# Patient Record
Sex: Female | Born: 1955 | Race: White | Hispanic: No | Marital: Married | Smoking: Former smoker
Health system: Southern US, Community
[De-identification: ages and names within clinical notes are randomized; demographics above are authoritative.]

## PROBLEM LIST (undated history)

## (undated) DIAGNOSIS — F32A Depression, unspecified: Secondary | ICD-10-CM

## (undated) DIAGNOSIS — E119 Type 2 diabetes mellitus without complications: Secondary | ICD-10-CM

## (undated) DIAGNOSIS — I2510 Atherosclerotic heart disease of native coronary artery without angina pectoris: Secondary | ICD-10-CM

## (undated) DIAGNOSIS — I10 Essential (primary) hypertension: Secondary | ICD-10-CM

## (undated) DIAGNOSIS — I219 Acute myocardial infarction, unspecified: Secondary | ICD-10-CM

## (undated) DIAGNOSIS — E7800 Pure hypercholesterolemia, unspecified: Secondary | ICD-10-CM

## (undated) DIAGNOSIS — D649 Anemia, unspecified: Secondary | ICD-10-CM

## (undated) DIAGNOSIS — E559 Vitamin D deficiency, unspecified: Secondary | ICD-10-CM

## (undated) DIAGNOSIS — F329 Major depressive disorder, single episode, unspecified: Secondary | ICD-10-CM

## (undated) HISTORY — PX: BACK SURGERY: SHX140

## (undated) HISTORY — PX: OTHER SURGICAL HISTORY: SHX169

---

## 2010-12-07 ENCOUNTER — Ambulatory Visit: Payer: Self-pay

## 2011-09-19 ENCOUNTER — Ambulatory Visit: Payer: Self-pay

## 2011-10-03 HISTORY — PX: CORONARY ANGIOPLASTY WITH STENT PLACEMENT: SHX49

## 2012-08-01 LAB — BASIC METABOLIC PANEL
BUN: 10 mg/dL (ref 7–18)
Co2: 27 mmol/L (ref 21–32)
Creatinine: 0.75 mg/dL (ref 0.60–1.30)
EGFR (Non-African Amer.): >60
Glucose: 369 mg/dL — ABNORMAL HIGH (ref 65–99)
Osmolality: 288 (ref 275–301)

## 2012-08-01 LAB — CBC
HGB: 14.7 g/dL (ref 12.0–16.0)
MCH: 34.1 pg — ABNORMAL HIGH (ref 26.0–34.0)
MCV: 98 fL (ref 80–100)
Platelet: 338 10*3/uL (ref 150–440)
RBC: 4.3 10*6/uL (ref 3.80–5.20)
WBC: 11.1 10*3/uL — ABNORMAL HIGH (ref 3.6–11.0)

## 2012-08-01 LAB — CK TOTAL AND CKMB (NOT AT ARMC)
CK, Total: 88 U/L (ref 21–215)
CK-MB: 1.3 ng/mL (ref 0.5–3.6)

## 2012-08-02 ENCOUNTER — Inpatient Hospital Stay: Payer: Self-pay | Admitting: Internal Medicine

## 2012-08-02 LAB — TROPONIN I
Troponin-I: 4.61 ng/mL — ABNORMAL HIGH
Troponin-I: 4.95 ng/mL — ABNORMAL HIGH

## 2012-08-02 LAB — HEPATIC FUNCTION PANEL A (ARMC)
Alkaline Phosphatase: 170 U/L — ABNORMAL HIGH (ref 50–136)
Bilirubin, Direct: <0.05 mg/dL (ref 0.00–0.20)
Bilirubin,Total: 0.4 mg/dL (ref 0.2–1.0)
Total Protein: 8.1 g/dL (ref 6.4–8.2)

## 2012-08-02 LAB — APTT
Activated PTT: 52 secs — ABNORMAL HIGH (ref 23.6–35.9)
Activated PTT: 66.6 secs — ABNORMAL HIGH (ref 23.6–35.9)
Activated PTT: 92 secs — ABNORMAL HIGH (ref 23.6–35.9)

## 2012-08-02 LAB — CK TOTAL AND CKMB (NOT AT ARMC)
CK-MB: 17.1 ng/mL — ABNORMAL HIGH (ref 0.5–3.6)
CK-MB: 12.6 ng/mL — ABNORMAL HIGH (ref 0.5–3.6)

## 2012-08-03 DIAGNOSIS — R079 Chest pain, unspecified: Secondary | ICD-10-CM

## 2012-08-03 LAB — LIPID PANEL
Cholesterol: 182 mg/dL (ref 0–200)
HDL Cholesterol: 27 mg/dL — ABNORMAL LOW (ref 40–60)
Ldl Cholesterol, Calc: 122 mg/dL — ABNORMAL HIGH (ref 0–100)
VLDL Cholesterol, Calc: 33 mg/dL (ref 5–40)

## 2012-08-03 LAB — BASIC METABOLIC PANEL
BUN: 11 mg/dL (ref 7–18)
Calcium, Total: 8.3 mg/dL — ABNORMAL LOW (ref 8.5–10.1)
EGFR (Non-African Amer.): >60
Glucose: 177 mg/dL — ABNORMAL HIGH (ref 65–99)
Potassium: 3.6 mmol/L (ref 3.5–5.1)
Sodium: 139 mmol/L (ref 136–145)

## 2012-08-03 LAB — CK TOTAL AND CKMB (NOT AT ARMC): CK-MB: 8.8 ng/mL — ABNORMAL HIGH (ref 0.5–3.6)

## 2012-08-03 LAB — PLATELET COUNT: Platelet: 284 10*3/uL (ref 150–440)

## 2012-08-21 ENCOUNTER — Encounter: Payer: Self-pay | Admitting: Cardiology

## 2012-09-01 ENCOUNTER — Encounter: Payer: Self-pay | Admitting: Cardiology

## 2012-10-02 ENCOUNTER — Encounter: Payer: Self-pay | Admitting: Cardiology

## 2012-10-15 ENCOUNTER — Ambulatory Visit: Payer: Self-pay | Admitting: Internal Medicine

## 2012-11-02 ENCOUNTER — Encounter: Payer: Self-pay | Admitting: Cardiology

## 2013-02-12 ENCOUNTER — Ambulatory Visit: Payer: Self-pay | Admitting: Internal Medicine

## 2013-02-12 LAB — CBC
MCH: 33.5 pg (ref 26.0–34.0)
MCHC: 35 g/dL (ref 32.0–36.0)
MCV: 96 fL (ref 80–100)
Platelet: 188 10*3/uL (ref 150–440)
RBC: 4.3 10*6/uL (ref 3.80–5.20)

## 2013-02-12 LAB — BASIC METABOLIC PANEL
Anion Gap: 8 (ref 7–16)
BUN: 12 mg/dL (ref 7–18)
Calcium, Total: 8.9 mg/dL (ref 8.5–10.1)
Chloride: 110 mmol/L — ABNORMAL HIGH (ref 98–107)
Glucose: 257 mg/dL — ABNORMAL HIGH (ref 65–99)
Osmolality: 284 (ref 275–301)

## 2013-02-12 LAB — CK TOTAL AND CKMB (NOT AT ARMC)
CK, Total: 67 U/L (ref 21–215)
CK-MB: <0.5 ng/mL — ABNORMAL LOW (ref 0.5–3.6)

## 2013-02-13 LAB — BASIC METABOLIC PANEL
Calcium, Total: 9.1 mg/dL (ref 8.5–10.1)
Chloride: 107 mmol/L (ref 98–107)
Creatinine: 0.6 mg/dL (ref 0.60–1.30)
EGFR (Non-African Amer.): >60
Glucose: 115 mg/dL — ABNORMAL HIGH (ref 65–99)
Osmolality: 282 (ref 275–301)
Potassium: 3.8 mmol/L (ref 3.5–5.1)
Sodium: 141 mmol/L (ref 136–145)

## 2013-02-13 LAB — LIPID PANEL
Ldl Cholesterol, Calc: 59 mg/dL (ref 0–100)
Triglycerides: 158 mg/dL (ref 0–200)
VLDL Cholesterol, Calc: 32 mg/dL (ref 5–40)

## 2013-02-13 LAB — CBC WITH DIFFERENTIAL/PLATELET
Basophil %: 0.7 %
Eosinophil %: 1.8 %
HCT: 41.7 % (ref 35.0–47.0)
HGB: 14.2 g/dL (ref 12.0–16.0)
Lymphocyte #: 1.7 10*3/uL (ref 1.0–3.6)
Lymphocyte %: 17.5 %
MCH: 32.7 pg (ref 26.0–34.0)
MCHC: 34 g/dL (ref 32.0–36.0)
Monocyte #: 0.7 x10 3/mm (ref 0.2–0.9)
Neutrophil #: 7.1 10*3/uL — ABNORMAL HIGH (ref 1.4–6.5)
Neutrophil %: 73 %
Platelet: 229 10*3/uL (ref 150–440)
WBC: 9.7 10*3/uL (ref 3.6–11.0)

## 2013-11-03 ENCOUNTER — Ambulatory Visit: Payer: Self-pay

## 2015-01-19 NOTE — Discharge Summary (Signed)
PATIENT NAME:  Taylor Norris, Taylor Norris MR#:  [id number] DATE OF BIRTH:  28-May-1956  DATE OF ADMISSION:  08/02/2012 DATE OF DISCHARGE:  08/03/2012   FINAL DIAGNOSES:  1. Non-ST elevation myocardial infarction, acute.  2. Diabetes mellitus.  3. Hyperlipidemia.  4. Hypertension.  5. Anxiety.   HISTORY AND PHYSICAL: Please see dictated admission history and physical.   SUMMARY OF HOSPITAL COURSE: The patient was admitted with chest discomfort. She was found to have ST elevation myocardial infarction. Cardiology was counseled, and she underwent cardiac catheterization, which revealed 90% stenosis of the proximal LAD with 100% mid LAD stenosis with good collateral blood supply. There was 50% mid RCA stenosis as well. She underwent PCI with drug eluding stent placement in the proximal LAD with excellent results.   She underwent echocardiogram which revealed preserved LV function. She is ambulating in the room post procedure with minimal leg discomfort, no further chest pain or shortness of breath. She was felt ready to go home, so at this point she will be discharged to home in stable condition with physical activity to be up as tolerated. Will anticipate her following up with Dr. Lady GaryFath and with her primary care physician, Dr. Yates DecampJohn Walker, within the next one week and with return to work to be determined at that time. She should follow an 1,800 calorie ADA diet, 2 grams sodium diet. She will check her sugars three times a day and record this.   DISCHARGE MEDICATIONS:  1. Novolin N 25 units subcutaneous at bedtime.  2. Novolin R 10 units subcutaneous t.i.d. after meals.  3. Metformin 500 mg p.o. b.i.d., to be started on 08/04/2012.  4. Cyclobenzaprine 5 mg p.o. t.i.d. p.r.n.  5. Venlafaxine 37.5 mg p.o. b.i.d.  6. Atorvastatin 40 mg p.o. at bedtime, dose increased.  7. Metoprolol tartrate 25 mg p.o. b.i.d.  8. Nitroglycerin 0.4 mg sublingually every five minutes x3 p.r.n. chest pain.  9. Plavix 75 mg  p.o. daily.  10. Aspirin 81 mg p.o. daily.   Note: She is given instructions to hold triamterene/HCTZ as this has been replaced. She should also avoid taking omeprazole while she is on Plavix.   ____________________________ Lynnea FerrierBert J. Klein III, MD bjk:ap D: 08/03/2012 09:02:39 ET T: 08/03/2012 14:41:14 ET JOB#: [id number]  cc: Lynnea FerrierBert J. Klein III, MD, <Dictator> Daniel NonesBERT KLEIN MD ELECTRONICALLY SIGNED 08/05/2012 13:02

## 2015-01-19 NOTE — H&P (Signed)
PATIENT NAME:  Taylor Norris, Taylor Norris MR#:  [id number] DATE OF BIRTH:  [birth year]  DATE OF ADMISSION:  08/02/2012  PRIMARY CARE PHYSICIAN: Dr. Yates Decamp   REFERRING PHYSICIAN: Dr. Si Raider    CHIEF COMPLAINT: Chest pain.   HISTORY OF PRESENT ILLNESS: Taylor Norris is a 59 year old Caucasian female with past medical history of diabetes mellitus type II on insulin. She is under tremendous stress with family issues. This culminated with the death of her mother yesterday. The patient reports that over the last six weeks she is experiencing chest pain that comes across the midchest area from the right to left but originates in the middle of her sternum. These are on and off to the extent that two weeks ago she called EMT and they did an EKG for her which was unremarkable. The patient did not come to the hospital at that time. Since that time she continued to have those episodic chest pains that last somewhere between 20 minutes up to three hours. The chest pain is worse upon lying down. She will have some relief if she sits up and holds her chest. This time around 7:30 she developed chest pain in the midsternal area. It did not stop and continued. There was associated shortness of breath, nausea, and one episode of vomiting. The pain was severe and continuous. At this per minute while examining the patient she stated that chest pain is easing off. Evaluation here revealed mildly elevated troponin. The patient is being admitted for further evaluation.   REVIEW OF SYSTEMS: CONSTITUTIONAL: Denies any fever. No chills. No fatigue. EYES: No blurring of vision. No double vision. ENT: No hearing impairment. No sore throat. No dysphagia. CARDIOVASCULAR: Reports chest pain and shortness of breath as above. No syncope. RESPIRATORY: Reports shortness of breath and chest pain. No cough. No hemoptysis. GASTROINTESTINAL: No abdominal pain but she had nausea and vomiting x1. GENITOURINARY: No dysuria. No frequency of  urination. MUSCULOSKELETAL: No joint pain or swelling other than her chronic neck pain. No muscular pain or swelling. INTEGUMENTARY: No skin rash. No ulcers. NEUROLOGY: No focal weakness. No seizure activity. No headache. PSYCHIATRY: She has a great deal of stress and anxiety. No depression. ENDOCRINE: No polyuria or polydipsia. No heat or cold intolerance.   PAST MEDICAL HISTORY: Diabetes mellitus type 2 on insulin, otherwise unremarkable history.   PAST SURGICAL HISTORY:  1. Cervical neck fusion.  2. History of Cesarean section.   SOCIAL HABITS: Chronic smoker of 12 cigarettes per day lately but average usually in the past was 3 cigarettes per day since age of 22. No history of alcohol or drug abuse.   SOCIAL HISTORY: She is married, living with her husband. 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 She quit five years ago to take care of her sick mother.   FAMILY HISTORY: Her mother just died yesterday at the age of 11 from complications of Alzheimer's dementia complicated by bedsores and failure to thrive. Her father died from complications of heart problems and coronary artery disease. She has a brother who died at age of 24 from alcoholic liver cirrhosis.   ADMISSION MEDICATIONS:  1. Venlafaxine 37.5 mg twice a day. 2. Triamterene/hydrochlorothiazide once a day. 3. Novolin R insulin 10 units 3 times a day. 4. Novolin N insulin 25 units at night once a day  5. Metformin 500 mg twice a day.  6. Cyclobenzaprine 5 mg three times a day p.r.n.  7. Atorvastatin 20 mg once a day.  ALLERGIES: Penicillin causes angioedema. Sulfa causes skin rash.   PHYSICAL EXAMINATION:   VITAL SIGNS: Blood pressure 143/77, respiratory rate 18, pulse 82, temperature 97.7, oxygen saturation 99%.   GENERAL APPEARANCE: Middle-aged female laying in bed in no acute distress.   HEAD: No pallor. No icterus. No cyanosis.   EARS, NOSE, AND THROAT: Hearing was normal. Nasal mucosa, lips,  tongue were normal.   EYES: Normal eyelids and conjunctivae. Pupils about 5 mm, equal and reactive to light.   NECK: Supple. Trachea at midline. No thyromegaly. No cervical lymphadenopathy. No masses.   HEART: Normal S1, S2. No S3 or S4. No murmur. No gallop. No carotid bruits.   RESPIRATORY: Normal breathing pattern without use of accessory muscles. No rales. No wheezing.   ABDOMEN: Soft without tenderness. No hepatosplenomegaly. No masses. No hernias.   SKIN: No ulcers. No subcutaneous nodules.   MUSCULOSKELETAL: No joint swelling. No clubbing.   NEUROLOGIC: Cranial nerves II through XII are intact. No focal motor deficit.   PSYCHIATRIC: The patient is alert and oriented x3. Mood and affect were normal.   LABORATORY, DIAGNOSTIC, AND RADIOLOGICAL DATA: EKG showed normal sinus rhythm at rate of 92 per minute. Incomplete right bundle branch block. T wave inversion in V1, V2, and aVL.   Serum glucose elevated at 369, BUN 10, creatinine 0.7, sodium 137, potassium 3.5. Liver function tests were normal except for slight elevation of alkaline phosphatase at 117. Total CK 88. CPK-MB fraction normal at 1.3. However, troponin is elevated at 0.5. CBC showed white count of 11,000, hemoglobin 14, hematocrit 42, platelet count 338.   ASSESSMENT:  1. Chest pain x6 weeks exacerbated in the last 24 hours, suspicious for unstable angina and possibly non-ST elevation myocardial infarction. There is associated elevated troponin.  2. Diabetes mellitus type II, on insulin.  3. Tobacco abuse.  4. Anxiety and family stress.  5. Chronic neck pain and history of cervical neck fusion.   PLAN:  1. Will admit the patient to the Intensive Care Unit.  2. Aspirin will be initiated.  3. I will start the patient on IV heparin protocol.  4. Sublingual nitroglycerin p.r.n.  5. Beta-blocker using metoprolol.  6. Follow-up on troponin.  7. Cardiology consultation.  8. Echocardiogram.  9. Add Plavix as well.   10. Insulin sliding scale and follow-up on blood sugar.  11. I will hold the diuretic to allow room for the beta-blocker.  12. I will also hold the metformin temporarily until the cardiologist decides whether he wants to pursue cardiac cath or not.   TIME SPENT EVALUATING THIS PATIENT: More than one hour.   ____________________________ Carney CornersAmir M. Rudene Rearwish, MD amd:drc D: 08/02/2012 01:49:14 ET T: 08/02/2012 06:03:19 ET JOB#: [id number]  cc: Carney CornersAmir M. Rudene Rearwish, MD, <Dictator> John B. Danne HarborWalker III, MD Karolee OhsAMIR Dala DockM Cason Dabney MD ELECTRONICALLY SIGNED 08/03/2012 22:33

## 2015-01-19 NOTE — Consult Note (Signed)
General Aspect patient is a 59 year old female with history of hypertension, diabetes mellitus, tobacco abuse, family history of heart disease who is admitted with progressive persistent chest pain. She has ruled in for a non-ST elevation myocardial infarction. Patient states she has had midsternal chest pain for several months. The episode prompting her admission has been present for 10-14 hours. Initial EKG was unremarkable with nonspecific ST T wave changes with sinus rhythm. Her initial serum troponin was 0.5. Subsequent serum troponin was 4.5. She still has mild chest tightness. Risk factors include family history, hypertension, hyperlipidemia, diabetes, she also is a tobacco smoker. She is currently hemodynamically stable.  she has been under a lot of stress recently due to  the illness and death of her mother  which occurred yesterday.   Physical Exam:   GEN well developed, no acute distress, obese    HEENT PERRL, hearing intact to voice    NECK supple    RESP normal resp effort  clear BS    CARD Regular rate and rhythm  Normal, S1, S2  No murmur    ABD denies tenderness  normal BS  no Adominal Mass    LYMPH negative neck, negative axillae    EXTR negative cyanosis/clubbing, negative edema    SKIN normal to palpation    NEURO cranial nerves intact, motor/sensory function intact    PSYCH A+O to time, place, person   Review of Systems:   Subjective/Chief Complaint chest pain    General: No Complaints    Skin: No Complaints    ENT: No Complaints    Eyes: No Complaints    Neck: No Complaints    Respiratory: No Complaints    Cardiovascular: Chest pain or discomfort    Gastrointestinal: No Complaints    Genitourinary: No Complaints    Vascular: No Complaints    Musculoskeletal: No Complaints    Neurologic: No Complaints    Hematologic: No Complaints    Endocrine: No Complaints    Psychiatric: No Complaints    Review of Systems: All other systems were  reviewed and found to be negative    Medications/Allergies Reviewed Medications/Allergies reviewed     Diabetes:    C-Section:    Cervical Fusion:   Home Medications: Medication Instructions Status  Novolin N 100 units/mL subcutaneous suspension 25 unit(s) subcutaneous once a day (at bedtime) Active  Novolin R 10 unit(s) subcutaneous 3 times a day (after meals) Active  metformin 500 mg oral tablet, extended release 1 tab(s) orally 2 times a day Active  cyclobenzaprine 5 mg oral tablet 1 tab(s) orally 3 times a day, As Needed Active  triam/hctz milligram(s) orally once a day Active  venlafaxine 37.5 mg oral tablet 1 tab(s) orally 2 times a day Active  atorvastatin 20 mg oral tablet 1 tab(s) orally once a day (at bedtime) Active   EKG:   EKG NSR    Abnormal NSSTTW changes    Penicillin: Angioedema  Sulfa drugs: Hives    Impression 59 year old female with no prior cardiac history with history of diabetes, hypertension, hyperlipidemia and tobacco abuse who was admitted with progressive chest pain. She has ruled in for a non-ST elevation myocardial infarction with a serum troponin 4.5. EKG shows nonspecific ST-T wave changes. She is currently hemodynamically stable but continues to have mild chest discomfort. She has been under a lot of stress due to  the illness and recent death of her mother. given her risk factors, elevated troponin and symptoms, will need to  evaluate her coronary anatomy. risk and benefits of left cardiac catheterization were explained to the patient and she agrees to proceed    Plan 2. Continue current medications however will discontinue Plavix for now until anatomy is elucidated 2. Low-fat, low-cholesterol, low-sodium, ADA diet 3. Weight loss 4. Discontinued tobacco 5. Further recommendations after cardiac catheterization is complete   Electronic Signatures: Dalia HeadingFath, Armstrong Creasy A (MD)  (Signed 01-Nov-13 13:23)  Authored: General Aspect/Present Illness, History  and Physical Exam, Review of System, Past Medical History, Home Medications, EKG , Allergies, Impression/Plan   Last Updated: 01-Nov-13 13:23 by Dalia HeadingFath, Jovoni Borkenhagen A (MD)

## 2015-01-22 NOTE — H&P (Signed)
PATIENT NAME:  Taylor Norris, Taylor Norris MR#:  [id number] DATE OF BIRTH:  11/16/1955  DATE OF ADMISSION:  02/12/2013  PRIMARY CARE PHYSICIAN: Dr. Yates DecampJohn Walker.  CARDIOLOGIST:  Dr. Lady GaryFath.  CHIEF COMPLAINT: Chest pain.   HISTORY OF PRESENT ILLNESS: This is a 59 year old female who had a stent in last November, and had a heart attack at that time. She presents back to the ER today. She let her dogs out this morning at 5:15 a.m., went back to sleep until 10:00 a.m. Did not feel good. She  ignored the chest pain that she had for awhile. It was a stabbing, then an ice-cold feeling in the chest, then a tightness, 8/10 in intensity. She has had left shoulder pain for about 3 days, pain across her shoulders and in her shoulder blades, associated with shortness of breath, nausea, and diaphoresis this morning, currently still having 5/10 chest pain. In the ER, EKG showed a sinus bradycardia, low voltage, septal infarct. Troponin was negative. Hospitalist services were contacted for further evaluation.   PAST MEDICAL HISTORY: Coronary artery disease with a stent in the LAD, diabetes, hypertension, hyperlipidemia, obesity and depression.   PAST SURGICAL HISTORY: Two C-sections, and a neck surgery.   ALLERGIES: PENICILLIN, SULFA, AND MORPHINE.   MEDICATIONS: As per Prescription Writer, include aspirin 81 mg daily, atorvastatin 20 mg at bedtime, cyclobenzaprine 5 mg 3 times a day as needed for muscle spasms, Dyazide 25/37.5,  1 tablet daily, nitroglycerin 0.4 mg sublingually every 5 minutes as needed for chest pain, Novolin N 25 units subcutaneous injection at nighttime, Novolin R 10 units 3 times a day with meals, venlafaxine 75 mg at bedtime.   SOCIAL HISTORY: No smoking. Rare alcohol. No drug use. Not working currently. Used to be an Lobbyistactivities director, and she is also a Research scientist (physical sciences)certified chef and was taking care of her mother for awhile until she passed away.    FAMILY HISTORY: Grandfather died at 6942 of an MI, also  drank alcohol. Father died at 4958 of heart failure. He also had a CABG, carotid endarterectomy, CVA. Brother died at 6859 of esophageal varices, also had a CABG, and mother died of Alzheimer's. A sister was murdered at age 58.   REVIEW OF SYSTEMS:  CONSTITUTIONAL: No fever, no chills. Positive for sweats. Positive 5-pound weight gain in  2 weeks.  EYES: She does wear glasses.  EARS, NOSE, MOUTH, AND THROAT: No hearing loss. No sore throat.  CARDIOVASCULAR: Positive for chest pain.  RESPIRATORY: Positive for shortness of breath.  GASTROINTESTINAL: Positive for nausea. No vomiting. No abdominal pain. No diarrhea. No constipation.  GENITOURINARY: No burning on urination. No hematuria.  MUSCULOSKELETAL: Occasional joint pain.  INTEGUMENT: No rashes.  NEUROLOGICAL: No fainting.  PSYCHIATRIC: Positive for depression. No suicidal ideation. No homicidal ideation.  ENDOCRINE: No thyroid problems.  HEMATOLOGIC/LYMPHATIC: No anemia.   PHYSICAL EXAMINATION: VITAL SIGNS: Temperature 97.7, pulse 54, respirations 18, blood pressure 102/58, pulse ox 97% on room air.  GENERAL: No respiratory distress.  EYES: Conjunctivae and lids normal. Pupils equal, round, and reactive to light. Extraocular muscles intact. No nystagmus.  EARS, NOSE, MOUTH, AND THROAT: Tympanic membranes: No erythema. Nasal mucosa: No erythema.  THROAT: No erythema. No exudate seen.  TEETH, LIPS AND GUMS: No lesions.  NECK: No JVD. No bruits. No lymphadenopathy. No thyromegaly. No thyroid nodules palpated.  LUNGS: Clear to auscultation. No use of accessory muscles to breathe. No rhonchi, rales, or wheeze heard.  CARDIOVASCULAR: S1, S2 normal. No gallops, rubs or murmurs  heard. Carotid upstroke  2+ bilaterally. No bruits.  EXTREMITIES: Dorsalis pedis pulses 2+ bilaterally. No edema of the lower extremities.  ABDOMEN: Soft, nontender. No organosplenomegaly. Normoactive bowel sounds. No masses felt.  LYMPHATIC: No lymph nodes in the neck.   MUSCULOSKELETAL: No clubbing, edema or cyanosis.  SKIN: No rashes or ulcers seen.  NEUROLOGIC: Cranial nerves II-XII grossly intact. Deep tendon reflexes 2+, bilateral lower extremities.  PSYCHIATRIC: The patient is oriented to person, place and time.   LABORATORY AND RADIOLOGICAL DATA: White blood cell count 9.0, H and H   14.4 and 41.1, platelet count of 188, glucose 257, BUN 12, creatinine 0.70, sodium  138, potassium 4.2, chloride 110, CO2 of 20, calcium 8.9. Troponin negative.   EKG: Sinus bradycardia at 59 beats per minute, low-voltage septal infarct.    ASSESSMENT AND PLAN: 1.  Chest pain, likely unstable angina, with history of coronary artery disease: Case discussed with Dr. Gwen Pounds covering for Dr. Lady Gary. To consider cardiac catheterization. The patient already took aspirin prior to coming into the hospital. Will continue that on a daily basis. Pharmacy tech tried to verify whether she is taking Plavix or Effient. I do not think she is. May need after cardiac cath. We will add metoprolol 25 mg b.i.d., and nitro patch.  2.  Hypertension: Blood pressure currently stable.  3.  Diabetes: Will check a hemoglobin A1c, put on sliding-scale, and will give half a dose of insulin tonight.  4.  Hyperlipidemia: Check a lipid profile in the a.m.; continue atorvastatin.  5.  Obesity. Weight loss definitely needed.  6.  Depression: The patient is having a hard time with this. Would increase venlafaxine to 150 mg at bedtime.   Time spent on admission: Fifty-five minutes.    ____________________________ Herschell Dimes. Renae Gloss, MD rjw:dm D: 02/12/2013 14:06:22 ET T: 02/12/2013 14:52:36 ET JOB#: [id number]  cc: Herschell Dimes. Renae Gloss, MD, <Dictator> John B. Danne Harbor, MD Darlin Priestly Lady Gary, MD Salley Scarlet MD ELECTRONICALLY SIGNED 02/20/2013 15:50

## 2015-01-22 NOTE — Consult Note (Signed)
PATIENT NAME:  Taylor Norris, Taylor Norris MR#:  [id number] DATE OF BIRTH:  Jun 11, 1956  DATE OF CONSULTATION:  02/12/2013  REFERRING PHYSICIAN:   Dr. Fonnie BirkenheadWhiting CONSULTING PHYSICIAN:  Lamar BlinksBruce J. Lilyauna Miedema, MD  REASON FOR CONSULTATION: Unstable angina with coronary artery disease, hypertension, hyperlipidemia, diabetes.   CHIEF COMPLAINT: "I have chest pain."  HISTORY OF PRESENT ILLNESS:  This is a 59 year old female with known coronary artery disease status post previous stent placement in left anterior descending artery and atretic distal left anterior descending artery with significant stenosis of other arteries. She has had appropriate medication management recently and has had a new onset of substernal chest discomfort radiating in her back causing tingling and shortness of breath when she awakened this morning. The patient has had this chest pain throughout the morning and was seen in the Emergency Room with some improvements with symptoms by the nitroglycerin. The patient now feels much better, although still has some residual chest discomfort. EKG shows normal sinus rhythm with no evidence of acute myocardial infarction. Troponin and CK-MB are within normal limits.   The remainder of review of systems negative for vision change, ringing in the ears, hearing loss, cough, congestion, heartburn, nausea, vomiting, diarrhea, bloody stools, stomach pain, extremity pain, leg weakness, cramping of the buttocks, known blood clots, headaches, blackouts, dizzy spells, nosebleeds, congestion, trouble swallowing, frequent urination, urination at night, muscle weakness, numbness, anxiety, depression, skin lesions or skin rashes.   PAST MEDICAL HISTORY: 1.  Coronary artery disease.  2.  Hypertension.  3.  Hyperlipidemia.  4.  Diabetes mellitus.  5.  Previous stenting.   FAMILY HISTORY: Positive for father having a myocardial infarction and death.   SOCIAL HISTORY:  Smokes 1 pack per day, denies alcohol use.    ALLERGIES:  As listed.   MEDICATIONS:  As listed.   PHYSICAL EXAMINATION: VITAL SIGNS: Blood pressure is 122/68 bilaterally, heart rate 72 upright, reclining and regular.  GENERAL:  She is a well-appearing female in no acute distress.  HEAD, EYES, EARS, NOSE AND THROAT:  No icterus, thyromegaly, ulcers, hemorrhage or xanthelasma.  CARDIOVASCULAR:  Regular rate and rhythm with normal S1 and S2 without murmur, gallop or rub. PMI is normal size and placement. Carotid upstroke normal without bruit. Jugular venous pressure normal.  LUNGS:  Have few basilar crackles with normal respirations.  ABDOMEN: Soft, nontender, without hepatosplenomegaly or masses. Abdominal aorta is normal size without bruit.  EXTREMITIES: Shows 2+ bilateral pulses in dorsal, pedal, radial and femoral arteries without lower extremity edema, cyanosis, clubbing or ulcers.  NEUROLOGIC:  She is oriented to time, place and person with normal mood and affect.   ASSESSMENT: A 59 year old female with coronary artery disease, hypertension, hyperlipidemia, diabetes, tobacco abuse with acute onset of substernal chest discomfort without evidence of current myocardial infarction with [place] class IV anginal equivalent.   RECOMMENDATIONS:  Proceed to cardiac catheterization to assess coronary anatomy and further treatment thereof as necessary. The patient understands the risks and benefits of cardiac catheterization. This includes the possibility of death, stroke, heart attack, infection, bleeding or blood clot. She is at low risk for conscious sedation.   ____________________________ Lamar BlinksBruce J. Dejohn Ibarra, MD bjk:ce D: 02/12/2013 17:34:26 ET T: 02/12/2013 17:53:24 ET JOB#: [id number]  cc: Lamar BlinksBruce J. Zamyia Gowell, MD, <Dictator> Lamar BlinksBRUCE J Yaffa Seckman MD ELECTRONICALLY SIGNED 02/13/2013 7:57

## 2015-12-15 ENCOUNTER — Other Ambulatory Visit: Payer: Self-pay | Admitting: Obstetrics and Gynecology

## 2015-12-15 ENCOUNTER — Ambulatory Visit
Admission: RE | Admit: 2015-12-15 | Discharge: 2015-12-15 | Disposition: A | Discharge status: Home / Self Care | Payer: Medicare HMO | Source: Ambulatory Visit | Attending: Obstetrics and Gynecology | Admitting: Obstetrics and Gynecology

## 2015-12-15 DIAGNOSIS — Z1231 Encounter for screening mammogram for malignant neoplasm of breast: Secondary | ICD-10-CM

## 2016-02-04 ENCOUNTER — Encounter: Payer: Self-pay | Admitting: *Deleted

## 2016-02-07 ENCOUNTER — Ambulatory Visit
Admission: RE | Admit: 2016-02-07 | Discharge: 2016-02-07 | Disposition: A | Discharge status: Home / Self Care | Payer: Medicare HMO | Source: Ambulatory Visit | Attending: Gastroenterology | Admitting: Gastroenterology

## 2016-02-07 ENCOUNTER — Ambulatory Visit: Payer: Medicare HMO | Admitting: Anesthesiology

## 2016-02-07 ENCOUNTER — Encounter
Admission: RE | Disposition: A | Discharge status: Home / Self Care | Payer: Self-pay | Source: Ambulatory Visit | Attending: Gastroenterology

## 2016-02-07 DIAGNOSIS — F329 Major depressive disorder, single episode, unspecified: Secondary | ICD-10-CM | POA: Insufficient documentation

## 2016-02-07 DIAGNOSIS — Z87891 Personal history of nicotine dependence: Secondary | ICD-10-CM | POA: Insufficient documentation

## 2016-02-07 DIAGNOSIS — Z7902 Long term (current) use of antithrombotics/antiplatelets: Secondary | ICD-10-CM | POA: Diagnosis not present

## 2016-02-07 DIAGNOSIS — K635 Polyp of colon: Secondary | ICD-10-CM | POA: Diagnosis not present

## 2016-02-07 DIAGNOSIS — I252 Old myocardial infarction: Secondary | ICD-10-CM | POA: Insufficient documentation

## 2016-02-07 DIAGNOSIS — Z7982 Long term (current) use of aspirin: Secondary | ICD-10-CM | POA: Diagnosis not present

## 2016-02-07 DIAGNOSIS — Z79899 Other long term (current) drug therapy: Secondary | ICD-10-CM | POA: Diagnosis not present

## 2016-02-07 DIAGNOSIS — E7800 Pure hypercholesterolemia, unspecified: Secondary | ICD-10-CM | POA: Diagnosis not present

## 2016-02-07 DIAGNOSIS — E559 Vitamin D deficiency, unspecified: Secondary | ICD-10-CM | POA: Diagnosis not present

## 2016-02-07 DIAGNOSIS — I10 Essential (primary) hypertension: Secondary | ICD-10-CM | POA: Insufficient documentation

## 2016-02-07 DIAGNOSIS — E119 Type 2 diabetes mellitus without complications: Secondary | ICD-10-CM | POA: Insufficient documentation

## 2016-02-07 DIAGNOSIS — Z794 Long term (current) use of insulin: Secondary | ICD-10-CM | POA: Insufficient documentation

## 2016-02-07 DIAGNOSIS — Z1211 Encounter for screening for malignant neoplasm of colon: Secondary | ICD-10-CM | POA: Insufficient documentation

## 2016-02-07 DIAGNOSIS — Z9889 Other specified postprocedural states: Secondary | ICD-10-CM | POA: Diagnosis not present

## 2016-02-07 DIAGNOSIS — I2510 Atherosclerotic heart disease of native coronary artery without angina pectoris: Secondary | ICD-10-CM | POA: Insufficient documentation

## 2016-02-07 HISTORY — DX: Atherosclerotic heart disease of native coronary artery without angina pectoris: I25.10

## 2016-02-07 HISTORY — DX: Type 2 diabetes mellitus without complications: E11.9

## 2016-02-07 HISTORY — DX: Vitamin D deficiency, unspecified: E55.9

## 2016-02-07 HISTORY — DX: Anemia, unspecified: D64.9

## 2016-02-07 HISTORY — DX: Acute myocardial infarction, unspecified: I21.9

## 2016-02-07 HISTORY — DX: Depression, unspecified: F32.A

## 2016-02-07 HISTORY — PX: COLONOSCOPY WITH PROPOFOL: SHX5780

## 2016-02-07 HISTORY — DX: Major depressive disorder, single episode, unspecified: F32.9

## 2016-02-07 HISTORY — DX: Essential (primary) hypertension: I10

## 2016-02-07 HISTORY — DX: Pure hypercholesterolemia, unspecified: E78.00

## 2016-02-07 LAB — GLUCOSE, CAPILLARY: GLUCOSE-CAPILLARY: 174 mg/dL — AB (ref 65–99)

## 2016-02-07 SURGERY — COLONOSCOPY WITH PROPOFOL
Anesthesia: General

## 2016-02-07 MED ORDER — PROPOFOL 10 MG/ML IV BOLUS
INTRAVENOUS | Status: DC | PRN
  Administered 2016-02-07: 50 mg via INTRAVENOUS
  Administered 2016-02-07: 40 mg via INTRAVENOUS
  Administered 2016-02-07: 50 mg via INTRAVENOUS

## 2016-02-07 MED ORDER — PROPOFOL 500 MG/50ML IV EMUL
INTRAVENOUS | Status: DC | PRN
  Administered 2016-02-07: 250 ug/kg/min via INTRAVENOUS

## 2016-02-07 MED ORDER — LIDOCAINE HCL (CARDIAC) 20 MG/ML IV SOLN
INTRAVENOUS | Status: DC | PRN
  Administered 2016-02-07: 60 mg via INTRAVENOUS

## 2016-02-07 MED ORDER — SODIUM CHLORIDE 0.9 % IV SOLN: INTRAVENOUS | Status: DC

## 2016-02-07 MED ORDER — SODIUM CHLORIDE 0.9 % IV SOLN
INTRAVENOUS | Status: DC
  Administered 2016-02-07: 1000 mL via INTRAVENOUS

## 2016-02-07 NOTE — Transfer of Care (Signed)
Immediate Anesthesia Transfer of Care Note  Patient: Taylor Norris  Procedure(s) Performed: Procedure(s): COLONOSCOPY WITH PROPOFOL (N/A)  Patient Location: Endoscopy Unit  Anesthesia Type:General  Level of Consciousness: awake, alert  and oriented  Airway & Oxygen Therapy: Patient Spontanous Breathing  Post-op Assessment: Post -op Vital signs reviewed and stable  Post vital signs: stable  Last Vitals:  Filed Vitals:   02/07/16 0900 02/07/16 0901  BP: 97/70 97/70  Pulse: 84 81  Temp: 36 C 36 C  Resp: 16 18    Last Pain: There were no vitals filed for this visit.       Complications: No apparent anesthesia complications

## 2016-02-07 NOTE — H&P (Signed)
Primary Care Physician:  Rafael BihariWALKER III, JOHN B, MD Primary Gastroenterologist:  Dr. Bluford Kaufmannh  Pre-Procedure History & Physical: HPI:  Taylor MoutonSherrilynn Kopecky is a 60 y.o. female is here for an colonoscopy.   Past Medical History  Diagnosis Date  . Anemia   . Coronary artery disease   . Depression   . Hypertension   . Myocardial infarction (HCC)   . Hypercholesteremia   . Diabetes mellitus without complication (HCC)   . Vitamin D deficiency     Past Surgical History  Procedure Laterality Date  . Back surgery      c3 disc repair  . C section x2      Prior to Admission medications   Medication Sig Start Date End Date Taking? Authorizing Provider  aspirin 81 MG tablet Take 81 mg by mouth daily.   Yes Historical Provider, MD  Cholecalciferol 2000 units CAPS Take by mouth.   Yes Historical Provider, MD  clopidogrel (PLAVIX) 75 MG tablet Take 75 mg by mouth daily.   Yes Historical Provider, MD  cyclobenzaprine (FLEXERIL) 5 MG tablet Take 5 mg by mouth 3 (three) times daily as needed for muscle spasms.   Yes Historical Provider, MD  DULoxetine (CYMBALTA) 60 MG capsule Take 60 mg by mouth daily.   Yes Historical Provider, MD  estradiol (ESTRACE) 0.1 MG/GM vaginal cream Place 1 Applicatorful vaginally at bedtime.   Yes Historical Provider, MD  gabapentin (NEURONTIN) 300 MG capsule Take 300 mg by mouth 3 (three) times daily.   Yes Historical Provider, MD  insulin NPH Human (HUMULIN N,NOVOLIN N) 100 UNIT/ML injection Inject into the skin.   Yes Historical Provider, MD  metFORMIN (GLUCOPHAGE-XR) 500 MG 24 hr tablet Take 500 mg by mouth daily with breakfast.   Yes Historical Provider, MD  metoprolol succinate (TOPROL-XL) 25 MG 24 hr tablet Take 25 mg by mouth daily.   Yes Historical Provider, MD    Allergies as of 01/26/2016  . (Not on File)    History reviewed. No pertinent family history.  Social History   Social History  . Marital Status: Unknown    Spouse Name: N/A  . Number of  Children: N/A  . Years of Education: N/A   Occupational History  . Not on file.   Social History Main Topics  . Smoking status: Former Games developermoker  . Smokeless tobacco: Not on file  . Alcohol Use: No  . Drug Use: No  . Sexual Activity: Not on file   Other Topics Concern  . Not on file   Social History Narrative    Review of Systems: See HPI, otherwise negative ROS  Physical Exam: BP 151/81 mmHg  Pulse 75  Temp(Src) 96.5 F (35.8 C) (Tympanic)  Resp 18  Ht 5\' 4"  (1.626 m)  Wt 220 lb (99.791 kg)  BMI 37.74 kg/m2  SpO2 99% General:   Alert,  pleasant and cooperative in NAD Head:  Normocephalic and atraumatic. Neck:  Supple; no masses or thyromegaly. Lungs:  Clear throughout to auscultation.    Heart:  Regular rate and rhythm. Abdomen:  Soft, nontender and nondistended. Normal bowel sounds, without guarding, and without rebound.   Neurologic:  Alert and  oriented x4;  grossly normal neurologically.  Impression/Plan: Taylor Norris is here for an colonoscopy} to be performed for screening  Risks, benefits, limitations, and alternatives regarding  colonocopy have been reviewed with the patient.  Questions have been answered.  All parties agreeable.   Sofia Vanmeter, Ezzard StandingPAUL Y, MD  02/07/2016, 7:59  AM

## 2016-02-07 NOTE — Op Note (Signed)
[facility] Gastroenterology Patient Name: Taylor Norris Procedure Date: 02/07/2016 8:30 AM MRN: [id number] Account #: [id number] Date of Birth: 1956-07-21 Admit Type: Outpatient Age: 60 Room: [facility] ENDO ROOM 4 Gender: Female Note Status: Finalized Procedure:            Colonoscopy Indications:          Screening for colorectal malignant neoplasm Providers:            Ezzard Standing. Bluford Kaufmann, MD Referring MD:         Letta Pate. Danne Harbor, MD (Referring MD) Medicines:            Monitored Anesthesia Care Complications:        No immediate complications. Procedure:            Pre-Anesthesia Assessment:                       - Prior to the procedure, a History and Physical was                        performed, and patient medications, allergies and                        sensitivities were reviewed. The patient's tolerance of                        previous anesthesia was reviewed.                       - The risks and benefits of the procedure and the                        sedation options and risks were discussed with the                        patient. All questions were answered and informed                        consent was obtained.                       - After reviewing the risks and benefits, the patient                        was deemed in satisfactory condition to undergo the                        procedure.                       After obtaining informed consent, the colonoscope was                        passed under direct vision. Throughout the procedure,                        the patient's blood pressure, pulse, and oxygen                        saturations were monitored continuously. The  Colonoscope was introduced through the anus and                        advanced to the the cecum, identified by appendiceal                        orifice and ileocecal valve. The colonoscopy was                        performed without difficulty.  The patient tolerated the                        procedure well. The quality of the bowel preparation                        was good. Findings:      A small polyp was found in the recto-sigmoid colon. The polyp was       sessile. The polyp was removed with a hot snare. Resection and retrieval       were complete.      The exam was otherwise without abnormality. Impression:           - One small polyp at the recto-sigmoid colon, removed                        with a hot snare. Resected and retrieved.                       - The examination was otherwise normal. Recommendation:       - Discharge patient to home.                       - Repeat colonoscopy in 5 years for surveillance based                        on pathology results.                       - The findings and recommendations were discussed with                        the patient.                       - Resume plavix in 2 days Procedure Code(s):    --- Professional ---                       [id number], Colonoscopy, flexible; with removal of tumor(s),                        polyp(s), or other lesion(s) by snare technique Diagnosis Code(s):    --- Professional ---                       Z12.11, Encounter for screening for malignant neoplasm                        of colon                       D12.7, Benign neoplasm of rectosigmoid junction CPT copyright 2016  American Medical Association. All rights reserved. The codes documented in this report are preliminary and upon coder review may  be revised to meet current compliance requirements. Wallace Cullens, MD 02/07/2016 9:00:25 AM This report has been signed electronically. Number of Addenda: 0 Note Initiated On: 02/07/2016 8:30 AM Scope Withdrawal Time: 0 hours 12 minutes 25 seconds  Total Procedure Duration: 0 hours 20 minutes 5 seconds       [facility]

## 2016-02-07 NOTE — Anesthesia Postprocedure Evaluation (Signed)
Anesthesia Post Note  Patient: Purcell MoutonSherrilynn Mattix  Procedure(s) Performed: Procedure(s) (LRB): COLONOSCOPY WITH PROPOFOL (N/A)  Patient location during evaluation: Endoscopy Anesthesia Type: General Level of consciousness: awake and alert Pain management: pain level controlled Vital Signs Assessment: post-procedure vital signs reviewed and stable Respiratory status: spontaneous breathing, nonlabored ventilation, respiratory function stable and patient connected to nasal cannula oxygen Cardiovascular status: blood pressure returned to baseline and stable Postop Assessment: no signs of nausea or vomiting Anesthetic complications: no    Last Vitals:  Filed Vitals:   02/07/16 0920 02/07/16 0930  BP: 125/81 120/71  Pulse: 73 65  Temp:    Resp: 15 13    Last Pain: There were no vitals filed for this visit.               Cleda MccreedyJoseph K Dilynn Munroe

## 2016-02-07 NOTE — Anesthesia Preprocedure Evaluation (Signed)
Anesthesia Evaluation  Patient identified by MRN, date of birth, ID band Patient awake    Reviewed: Allergy & Precautions, H&P , NPO status , Patient's Chart, lab work & pertinent test results  History of Anesthesia Complications Negative for: history of anesthetic complications  Airway Mallampati: III  TM Distance: >3 FB Neck ROM: limited    Dental  (+) Poor Dentition   Pulmonary neg shortness of breath, former smoker,    Pulmonary exam normal breath sounds clear to auscultation       Cardiovascular Exercise Tolerance: Good hypertension, (-) angina+ CAD, + Past MI and + Cardiac Stents  (-) DOE Normal cardiovascular exam Rhythm:regular Rate:Normal     Neuro/Psych PSYCHIATRIC DISORDERS Depression negative neurological ROS     GI/Hepatic negative GI ROS, Neg liver ROS,   Endo/Other  diabetes, Type 2, Insulin Dependent  Renal/GU negative Renal ROS  negative genitourinary   Musculoskeletal   Abdominal   Peds  Hematology negative hematology ROS (+)   Anesthesia Other Findings Past Medical History:   Anemia                                                       Coronary artery disease                                      Depression                                                   Hypertension                                                 Myocardial infarction (HCC)                                  Hypercholesteremia                                           Diabetes mellitus without complication (HCC)                 Vitamin D deficiency                                        Past Surgical History:   BACK SURGERY                                                    Comment:c3 disc repair   c section x2  BMI    Body Mass Index   37.74 kg/m 2      Reproductive/Obstetrics negative OB ROS                             Anesthesia  Physical Anesthesia Plan  ASA: III  Anesthesia Plan: General   Post-op Pain Management:    Induction:   Airway Management Planned:   Additional Equipment:   Intra-op Plan:   Post-operative Plan:   Informed Consent: I have reviewed the patients History and Physical, chart, labs and discussed the procedure including the risks, benefits and alternatives for the proposed anesthesia with the patient or authorized representative who has indicated his/her understanding and acceptance.   Dental Advisory Given  Plan Discussed with: Anesthesiologist, CRNA and Surgeon  Anesthesia Plan Comments:         Anesthesia Quick Evaluation

## 2016-02-08 ENCOUNTER — Encounter: Payer: Self-pay | Admitting: Gastroenterology

## 2016-02-08 LAB — SURGICAL PATHOLOGY

## 2018-08-13 ENCOUNTER — Ambulatory Visit: Payer: Medicare HMO | Admitting: Physical Therapy

## 2018-08-13 ENCOUNTER — Ambulatory Visit: Payer: Medicare HMO | Attending: Obstetrics and Gynecology | Admitting: Physical Therapy

## 2018-08-13 ENCOUNTER — Encounter: Payer: Self-pay | Admitting: Physical Therapy

## 2018-08-13 ENCOUNTER — Other Ambulatory Visit: Payer: Self-pay

## 2018-08-13 DIAGNOSIS — M25512 Pain in left shoulder: Secondary | ICD-10-CM | POA: Diagnosis present

## 2018-08-13 DIAGNOSIS — M545 Low back pain, unspecified: Secondary | ICD-10-CM

## 2018-08-13 DIAGNOSIS — G8929 Other chronic pain: Secondary | ICD-10-CM | POA: Diagnosis present

## 2018-08-13 DIAGNOSIS — M62830 Muscle spasm of back: Secondary | ICD-10-CM | POA: Diagnosis present

## 2018-08-13 NOTE — Patient Instructions (Addendum)
  Bladder irritants * handout  Starting point: 120 fl oz water,  60 fl oz unsweetened tea, 4 cups of coffee, and 2 diet sodas 16 floz.        This week: decrease to 2 cups of coffee   ______   Stretches:  morning and night    1) Seated:  brush palm on thigh back to groin , bringing elbows back and shoulders roll back and down, feelign the chest lift, head moves back  10x x3   2) Seated stretches with feet on ground :  Seated twist,  Inhale, lengthen spine tall,  Exhale, turn nvael to the R , then the chest, look over R shoulderL hand on R thigh, R hand by the hips on the bed.chair  Other side.   3 breaths    ____  Stretches at [employer] counter  Lengthening stretch for back at counter hands shoulder width apart , knees slightly bend, like a mini squat 5 breaths   Keep L hand on counter R hand on L thigh, gaze under armpit  5 breaths then switch    R hand on R hip and twist ribs to R and gaze right    To stand up, shift knees forward , buttocks tuck under and then chest lifts
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
## 2018-08-20 ENCOUNTER — Ambulatory Visit: Payer: Medicare HMO | Admitting: Physical Therapy

## 2018-08-20 DIAGNOSIS — G8929 Other chronic pain: Secondary | ICD-10-CM

## 2018-08-20 DIAGNOSIS — M25512 Pain in left shoulder: Secondary | ICD-10-CM | POA: Diagnosis not present

## 2018-08-20 DIAGNOSIS — M545 Low back pain: Secondary | ICD-10-CM

## 2018-08-20 DIAGNOSIS — M62830 Muscle spasm of back: Secondary | ICD-10-CM

## 2018-08-20 NOTE — Therapy (Signed)
[facility] [facility] [address] [place], [place], [address] Phone: [phone]   Fax:  [phone]  Physical Therapy Treatment  Patient Details  Name: Taylor Norris MRN: [id number] Date of Birth: 10/22/1955 Referring Provider (PT): Dalbert GarnetBeasley   Encounter Date: 08/20/2018  PT End of Session - 08/20/18 1602    Visit Number  2    Number of Visits  12    Date for PT Re-Evaluation  11/05/18    Authorization Type  Medicaid 2/10    PT Start Time  1504    PT Stop Time  1602    PT Time Calculation (min)  58 min       Past Medical History:  Diagnosis Date  . Anemia   . Coronary artery disease   . Depression   . Diabetes mellitus without complication (HCC)   . Hypercholesteremia   . Hypertension   . Myocardial infarction (HCC)   . Vitamin D deficiency     Past Surgical History:  Procedure Laterality Date  . BACK SURGERY     c3 disc repair  . c section x2    . COLONOSCOPY WITH PROPOFOL N/A 02/07/2016   Procedure: COLONOSCOPY WITH PROPOFOL;  Surgeon: Wallace CullensPaul Y Oh, MD;  [facility];  Service: Gastroenterology;  Laterality: N/A;  . CORONARY ANGIOPLASTY WITH STENT PLACEMENT  2013    There were no vitals filed for this visit.  Subjective Assessment - 08/20/18 1558    Subjective  Pt reported 30% improvement with her LBP since last session    Pertinent History  2 c-sections, neck surgery C3,  comorbidities.  Performs water aerobics 2 x week ( 2hr each), walk 1 mile without a stretching routine    Patient Stated Goals  relief of L shoulder pain, pelvic pain, and not peeing when walking in the house.          [facility] Assessment - 08/20/18 1559      Palpation   Spinal mobility  interspinal/ paraspinal, medial mm to scapular B ,                     [facility] Adult PT Treatment/Exercise - 08/20/18 1559      Neuro Re-ed    Neuro Re-ed Details   see pt instructions      Moist Heat Therapy   Number  Minutes Moist Heat  5 Minutes    Moist Heat Location  Cervical   thoracic      Manual Therapy   Manual therapy comments  MWM/ STM at interspinal/ paraspinal, medial mm to scapular B ,                   PT Long Term Goals - 08/15/18 [id number]      PT LONG TERM GOAL #1   Title  Pt will decrease ODI score from 40% to <20 % in order to perform household chores with less pain     Time  8    Period  Weeks    Status  New      PT LONG TERM GOAL #2   Title  Pt will decrease her NDI score from  24% to  < 12 % in order to rollonto her L side and sleep sidelying      Time  8    Period  Weeks    Status  New      PT LONG TERM GOAL #3   Title  Pt will decrease PFDI from 24% to <12 % in order to restore pelvic floor function    Time  12    Period  Weeks    Status  New      PT LONG TERM GOAL #4   Title  Pt will demo decrease abdominal scar restrictions and demo proper pelvic floor coordination, ROM in order to decrease urinary frequency and urgency      Time  4    Period  Weeks    Status  New      PT LONG TERM GOAL #5   Title  Pt will demo no L SIJ hpomobility, no thoracic hypomobility in order to progress to pelvic floor exercises     Time  2    Period  Weeks    Status  New      Additional Long Term Goals   Additional Long Term Goals  Yes      PT LONG TERM GOAL #6   Title  Pt will be IND with mobility HEP     Time  10    Period  Weeks    Status  New      PT LONG TERM GOAL #7   Title  Pt will report decreased urinary frequency from 2 x / 2 hour to < 1x / hr and being able to make it to the toilet without leaking in order to improve QOL     Time  6    Period  Weeks    Status  New    Target Date  09/26/18      PT LONG TERM GOAL #8   Title  Pt will report more consistent bowel movements from once to every 3 days to everyday / every other day across 2 weeks and improved Stool consistency Stool Type 5-7 across 70% of the time, Stool Type 4  30% of the time  to < 30% of the  time, Stool Type 4 70% of the time      Time  12    Period  Weeks    Status  New    Target Date  11/07/18            Plan - 08/20/18 1603    Clinical Impression Statement  Pt demod'd decreased thoracic and cervical mm tensions post Tx. Pt reported 40% improvement with upper low back pain which had improved at 30 % after first visit.  Pt reported feeling "calm, relaxed, content" after the relaxation training. Pt continues to benefit from skilled PT.     Rehab Potential  Good    PT Frequency  1x / week    PT Duration  12 weeks    PT Treatment/Interventions  Moist Heat;Functional mobility training;Stair training;Therapeutic activities;Therapeutic exercise;Neuromuscular re-education;Scar mobilization;Taping;Electrical Stimulation;Biofeedback;Aquatic Therapy    Consulted and Agree with Plan of Care  Patient       Patient will benefit from skilled therapeutic intervention in order to improve the following deficits and impairments:  Increased muscle spasms, Increased fascial restricitons, Decreased scar mobility, Hypomobility, Impaired flexibility, Improper body mechanics, Postural dysfunction, Decreased strength, Decreased mobility, Pain, Decreased coordination, Decreased endurance, Decreased activity tolerance, Decreased balance, Decreased range of motion  Visit Diagnosis: Chronic left shoulder pain  Chronic midline low back pain, unspecified whether sciatica present  Muscle spasm of back     Problem List There are no active problems to display for this patient.   Mariane Masters ,PT, DPT, E-RYT  08/20/2018, 4:06 PM  [facility] MAIN [facility] SERVICES [address] [place], [place], [address] Phone: [phone]   Fax:  [phone]  Name: Taylor Norris MRN: [id number] Date of Birth: [birth year]

## 2018-08-20 NOTE — Patient Instructions (Addendum)
Create a relaxation time in afternoon everyday ( use a calm sounding alarm on the phone for 15 min  _ use ocean sounds which you associate relaxation with  _ notice your body sinking to the bed, arms out, letting go _beginning long paced breathing which will help with relaxing your body  , cover eyes with clothes, edge of body being supported  _ allow yourself to relax onto the surface beneath you ( can propped legs up under pillows)   When the alarm go off, don't immediate get up to turn it off,  Move gradually, log roll to the side, and then sit up.  __  Open book ( handout)

## 2018-08-27 ENCOUNTER — Encounter: Payer: Self-pay | Admitting: Physical Therapy

## 2018-09-03 ENCOUNTER — Ambulatory Visit: Payer: Medicare HMO | Attending: Obstetrics and Gynecology | Admitting: Physical Therapy

## 2018-09-03 DIAGNOSIS — G8929 Other chronic pain: Secondary | ICD-10-CM | POA: Diagnosis present

## 2018-09-03 DIAGNOSIS — M25512 Pain in left shoulder: Secondary | ICD-10-CM | POA: Diagnosis not present

## 2018-09-03 DIAGNOSIS — M62838 Other muscle spasm: Secondary | ICD-10-CM | POA: Insufficient documentation

## 2018-09-03 DIAGNOSIS — M545 Low back pain: Secondary | ICD-10-CM | POA: Insufficient documentation

## 2018-09-03 DIAGNOSIS — M62830 Muscle spasm of back: Secondary | ICD-10-CM | POA: Insufficient documentation

## 2018-09-03 NOTE — Therapy (Addendum)
[place] [facility] MAIN [facility] SERVICES [address] [place], [place], [address] Phone: [phone]   Fax:  [phone]  Physical Therapy Treatment  Patient Details  Name: Taylor Norris MRN: [id number] Date of Birth: [birth year] Referring Provider (PT): Dalbert Garnet   Encounter Date: 09/03/2018  PT End of Session - 09/03/18 1545    Visit Number  3    Number of Visits  12    Date for PT Re-Evaluation  11/05/18    Authorization Type  3/10 medicare    PT Start Time  1503    PT Stop Time  1550    PT Time Calculation (min)  47 min       Past Medical History:  Diagnosis Date  . Anemia   . Coronary artery disease   . Depression   . Diabetes mellitus without complication (HCC)   . Hypercholesteremia   . Hypertension   . Myocardial infarction (HCC)   . Vitamin D deficiency     Past Surgical History:  Procedure Laterality Date  . BACK SURGERY     c3 disc repair  . c section x2    . COLONOSCOPY WITH PROPOFOL N/A 02/07/2016   Procedure: COLONOSCOPY WITH PROPOFOL;  Surgeon: Wallace Cullens, MD;  Location: [facility] ENDOSCOPY;  Service: Gastroenterology;  Laterality: N/A;  . CORONARY ANGIOPLASTY WITH STENT PLACEMENT  2013    There were no vitals filed for this visit.  Subjective Assessment - 09/03/18 1515    Subjective  Pt was able to sleep for 13 days out of 2weeks without shoulder pain. The shoulder pain is there with the cold weather. Pt cut back on her coffee.     Pertinent History  2 c-sections, neck surgery C3,  comorbidities.  Performs water aerobics 2 x week ( 2hr each), walk 1 mile without a stretching routine    Patient Stated Goals  relief of L shoulder pain, pelvic pain, and not peeing when walking in the house.          [facility] PT Assessment - 09/03/18 1541      Observation/Other Assessments   Observations  forward head at C/T j unction 130 deg        Palpation   Spinal mobility  signficant tensions along upper thoracic, levator.scalenes,  upper trap B ( decreased post Tx)                    [facility] Adult PT Treatment/Exercise - 09/03/18 1542      Neuro Re-ed    Neuro Re-ed Details   see pt instructions      Moist Heat Therapy   Number Minutes Moist Heat  5 Minutes    Moist Heat Location  Cervical      Manual Therapy   Manual therapy comments  MWM/ STM at problem areas noted                   PT Long Term Goals - 09/03/18 1551      PT LONG TERM GOAL #1   Title  Pt will decrease ODI score from 40% to <20 % in order to perform household chores with less pain     Time  8    Period  Weeks    Status  New      PT LONG TERM GOAL #2   Title  Pt will decrease her NDI score from  24% to  < 12 % in order to rollonto her L side  and sleep sidelying      Time  8    Period  Weeks    Status  New      PT LONG TERM GOAL #3   Title  Pt will decrease PFDI from 24% to <12 % in order to restore pelvic floor function    Time  12    Period  Weeks    Status  New      PT LONG TERM GOAL #4   Title  Pt will demo decrease abdominal scar restrictions and demo proper pelvic floor coordination, ROM in order to decrease urinary frequency and urgency      Time  4    Period  Weeks    Status  New      PT LONG TERM GOAL #5   Title  Pt will demo no L SIJ hpomobility, no thoracic hypomobility in order to progress to pelvic floor exercises     Time  2    Period  Weeks    Status  New      PT LONG TERM GOAL #6   Title  Pt will be IND with mobility HEP     Time  10    Period  Weeks    Status  New      PT LONG TERM GOAL #7   Title  Pt will report decreased urinary frequency from 2 x / 2 hour to < 1x / hr and being able to make it to the toilet without leaking in order to improve QOL     Time  6    Period  Weeks    Status  New      PT LONG TERM GOAL #8   Title  Pt will report more consistent bowel movements from once to every 3 days to everyday / every other day across 2 weeks and improved Stool consistency Stool  Type 5-7 across 70% of the time, Stool Type 4  30% of the time  to < 30% of the time, Stool Type 4 70% of the time      Time  12    Period  Weeks    Status  New      PT LONG TERM GOAL  #10   TITLE  Pt will demo increased angle at C/T junction from 130 deg to > 150 deg in order to demo upright posture and decreased upper neck / shoulder mm tensions for optimal diaphragmatic excursion and pelvic floor function    Time  12    Period  Weeks    Status  New            Plan - 09/03/18 1546    Clinical Impression Statement  Pt is improving with sleeping without neck pain for the past 13 days out 14 days. Addressed C/T mm tensions and restrictions along with upper trap/levator/scalenes with manual Tx which pt tolerated without increased pain. Initiated cervical/scapular retraction strengthening and scapular mobility exercises. Pt continues to benefit from skilled PT to improve posture and optimize deep core function.    Rehab Potential  Good    PT Frequency  1x / week    PT Duration  12 weeks    PT Treatment/Interventions  Moist Heat;Functional mobility training;Stair training;Therapeutic activities;Therapeutic exercise;Neuromuscular re-education;Scar mobilization;Taping;Electrical Stimulation;Biofeedback;Aquatic Therapy    Consulted and Agree with Plan of Care  Patient       Patient will benefit from skilled therapeutic intervention in order to improve the following deficits and impairments:  Increased muscle spasms, Increased fascial restricitons, Decreased scar mobility, Hypomobility, Impaired flexibility, Improper body mechanics, Postural dysfunction, Decreased strength, Decreased mobility, Pain, Decreased coordination, Decreased endurance, Decreased activity tolerance, Decreased balance, Decreased range of motion  Visit Diagnosis: Chronic left shoulder pain  Chronic midline low back pain, unspecified whether sciatica present  Muscle spasm of back     Problem List There are no  active problems to display for this patient.   Mariane MastersYeung,Shin Yiing ,PT, DPT, E-RYT  09/03/2018, 3:54 PM  [place] [facility] [address] [place], [place], [address] Phone: [phone]   Fax:  [phone]  Name: Taylor Norris MRN: [id number] Date of Birth: 04/10/1956

## 2018-09-03 NOTE — Patient Instructions (Signed)
Neck stretches at night/ morning on bed: 1) 6 directions of neck with shoulder blades down and back 5 reps each A. Releasing upper trap:  Tilt head to L shoulder, "shoot  Beams" through fingers pointing towards feet to lower R shoulder away from ears, turn head to look into L armpit    2)Angel wings up and bat wings down 10 reps  3) shoulder squeezes down and back with chin tuck  Count aloud 5 sec x 1 0 reps to retrain where the neck needs to be ( in line with sacrum)

## 2018-09-10 ENCOUNTER — Ambulatory Visit: Payer: Medicare HMO | Admitting: Physical Therapy

## 2018-09-10 DIAGNOSIS — M62830 Muscle spasm of back: Secondary | ICD-10-CM

## 2018-09-10 DIAGNOSIS — M545 Low back pain, unspecified: Secondary | ICD-10-CM

## 2018-09-10 DIAGNOSIS — G8929 Other chronic pain: Secondary | ICD-10-CM

## 2018-09-10 DIAGNOSIS — M25512 Pain in left shoulder: Principal | ICD-10-CM

## 2018-09-10 NOTE — Therapy (Signed)
[place] [facility] MAIN [facility] SERVICES [address] [place], [place], [address] Phone: [phone]   Fax:  [phone]  Physical Therapy Treatment  Patient Details  Name: Taylor Norris MRN: [id number] Date of Birth: Oct 20, 1955 Referring Provider (PT): Dalbert Garnet   Encounter Date: 09/10/2018  PT End of Session - 09/10/18 1604    Visit Number  4    Number of Visits  12    Date for PT Re-Evaluation  11/05/18    Authorization Type  4/10 medicare    PT Start Time  1504    PT Stop Time  1606    PT Time Calculation (min)  62 min       Past Medical History:  Diagnosis Date  . Anemia   . Coronary artery disease   . Depression   . Diabetes mellitus without complication (HCC)   . Hypercholesteremia   . Hypertension   . Myocardial infarction (HCC)   . Vitamin D deficiency     Past Surgical History:  Procedure Laterality Date  . BACK SURGERY     c3 disc repair  . c section x2    . COLONOSCOPY WITH PROPOFOL N/A 02/07/2016   Procedure: COLONOSCOPY WITH PROPOFOL;  Surgeon: Wallace Cullens, MD;  Location: [facility] ENDOSCOPY;  Service: Gastroenterology;  Laterality: N/A;  . CORONARY ANGIOPLASTY WITH STENT PLACEMENT  2013    There were no vitals filed for this visit.  Subjective Assessment - 09/10/18 1605    Subjective  Pt continues sleep better and this past week, pt has not had to take any medication for L shoulder pain.     Pertinent History  2 c-sections, neck surgery C3,  comorbidities.  Performs water aerobics 2 x week ( 2hr each), walk 1 mile without a stretching routine    Patient Stated Goals  relief of L shoulder pain, pelvic pain, and not peeing when walking in the house.          [facility] PT Assessment - 09/10/18 1600      Palpation   Spinal mobility  significant tightness at interspinal mm T/L junction, hypomobility at T10-L3     Palpation comment  mild scar restrictions at C-sections                    [facility] Adult PT  Treatment/Exercise - 09/10/18 1600      Neuro Re-ed    Neuro Re-ed Details   see pt instructions      Moist Heat Therapy   Number Minutes Moist Heat  5 Minutes    Moist Heat Location  Other (comment)   thoracic      Manual Therapy   Manual therapy comments  Grade III mob PA , MWM/ STM at problem areas noted                   PT Long Term Goals - 09/03/18 1551      PT LONG TERM GOAL #1   Title  Pt will decrease ODI score from 40% to <20 % in order to perform household chores with less pain     Time  8    Period  Weeks    Status  New      PT LONG TERM GOAL #2   Title  Pt will decrease her NDI score from  24% to  < 12 % in order to rollonto her L side and sleep sidelying      Time  8  Period  Weeks    Status  New      PT LONG TERM GOAL #3   Title  Pt will decrease PFDI from 24% to <12 % in order to restore pelvic floor function    Time  12    Period  Weeks    Status  New      PT LONG TERM GOAL #4   Title  Pt will demo decrease abdominal scar restrictions and demo proper pelvic floor coordination, ROM in order to decrease urinary frequency and urgency      Time  4    Period  Weeks    Status  New      PT LONG TERM GOAL #5   Title  Pt will demo no L SIJ hpomobility, no thoracic hypomobility in order to progress to pelvic floor exercises     Time  2    Period  Weeks    Status  New      PT LONG TERM GOAL #6   Title  Pt will be IND with mobility HEP     Time  10    Period  Weeks    Status  New      PT LONG TERM GOAL #7   Title  Pt will report decreased urinary frequency from 2 x / 2 hour to < 1x / hr and being able to make it to the toilet without leaking in order to improve QOL     Time  6    Period  Weeks    Status  New      PT LONG TERM GOAL #8   Title  Pt will report more consistent bowel movements from once to every 3 days to everyday / every other day across 2 weeks and improved Stool consistency Stool Type 5-7 across 70% of the time, Stool Type 4   30% of the time  to < 30% of the time, Stool Type 4 70% of the time      Time  12    Period  Weeks    Status  New      PT LONG TERM GOAL  #10   TITLE  Pt will demo increased angle at C/T junction from 130 deg to > 150 deg in order to demo upright posture and decreased upper neck / shoulder mm tensions for optimal diaphragmatic excursion and pelvic floor function    Time  12    Period  Weeks    Status  New            Plan - 09/10/18 1604    Clinical Impression Statement  Pt showed decreased thoracic mm tightness and increased lower thoracic segments mobility post Tx today. Initiated C-section scar releases. Initated scapular stabilization and deep core strengthening. Pt continues to benefit from skilled PT.  Plan to assess pelvic floor at upcoming session     Rehab Potential  Good    PT Frequency  1x / week    PT Duration  12 weeks    PT Treatment/Interventions  Moist Heat;Functional mobility training;Stair training;Therapeutic activities;Therapeutic exercise;Neuromuscular re-education;Scar mobilization;Taping;Electrical Stimulation;Biofeedback;Aquatic Therapy    Consulted and Agree with Plan of Care  Patient       Patient will benefit from skilled therapeutic intervention in order to improve the following deficits and impairments:  Increased muscle spasms, Increased fascial restricitons, Decreased scar mobility, Hypomobility, Impaired flexibility, Improper body mechanics, Postural dysfunction, Decreased strength, Decreased mobility, Pain, Decreased coordination, Decreased endurance, Decreased activity  tolerance, Decreased balance, Decreased range of motion  Visit Diagnosis: Chronic left shoulder pain  Muscle spasm of back  Chronic midline low back pain, unspecified whether sciatica present     Problem List There are no active problems to display for this patient.   Mariane MastersYeung,Shin Yiing ,PT, DPT, E-RYT  09/10/2018, 4:09 PM  [place] [facility]  MAIN [facility] [address] [place], [place], [address] Phone: [phone]   Fax:  [phone]  Name: Taylor Norris MRN: [id number] Date of Birth: 04/29/1956

## 2018-09-10 NOTE — Patient Instructions (Signed)
"  Thread the Needle"   Or at kitchen counter: Mini squat L hand on counter, place R hand onto L thigh  Look under L armpit   Switch KEEP HIPS centered, not move them   3 breath  _______   Wall squat , pressing shoulders, elbows into the wall Inhale down knee behind toes Exhale up, keep knees unlocked  20 reps    ______ Strengthening  Handout  Deep core Level 1 ( 10 breaths)   Deep core level 2 ( 6 min)

## 2018-09-17 ENCOUNTER — Ambulatory Visit: Payer: Medicare HMO | Admitting: Physical Therapy

## 2018-09-17 DIAGNOSIS — M62830 Muscle spasm of back: Secondary | ICD-10-CM

## 2018-09-17 DIAGNOSIS — M25512 Pain in left shoulder: Principal | ICD-10-CM

## 2018-09-17 DIAGNOSIS — M545 Low back pain: Secondary | ICD-10-CM

## 2018-09-17 DIAGNOSIS — G8929 Other chronic pain: Secondary | ICD-10-CM

## 2018-09-17 NOTE — Patient Instructions (Addendum)
   Dragging L arm, no pillow under head    Angel wing up, tilt head, L ear to shoulder Lower down with elbow bent,  Straighten elbow, palms face hip, "laser fingers" , pulling shoulder down  Rotate head to R , feel stretch on L neck while pulling shoulder down    5-10 x in the morning and eve   ___  RAISE ARM OVERHEAD to log roll in roder to get out of bed instead of flinching with your neck muscles

## 2018-09-17 NOTE — Therapy (Signed)
[place] [facility] MAIN [facility] SERVICES [address] [place], [place], [address] Phone: [phone]   Fax:  [phone]  Physical Therapy Treatment  Patient Details  Name: Taylor Norris MRN: [id number] Date of Birth: 01-16-1956 Referring Provider (PT): Dalbert Garnet   Encounter Date: 09/17/2018  PT End of Session - 09/17/18 1545    Visit Number  5    Number of Visits  12    Date for PT Re-Evaluation  11/05/18    Authorization Type  5/10 medicare    PT Start Time  1503    PT Stop Time  1545    PT Time Calculation (min)  42 min       Past Medical History:  Diagnosis Date  . Anemia   . Coronary artery disease   . Depression   . Diabetes mellitus without complication (HCC)   . Hypercholesteremia   . Hypertension   . Myocardial infarction (HCC)   . Vitamin D deficiency     Past Surgical History:  Procedure Laterality Date  . BACK SURGERY     c3 disc repair  . c section x2    . COLONOSCOPY WITH PROPOFOL N/A 02/07/2016   Procedure: COLONOSCOPY WITH PROPOFOL;  Surgeon: Wallace Cullens, MD;  Location: [facility] ENDOSCOPY;  Service: Gastroenterology;  Laterality: N/A;  . CORONARY ANGIOPLASTY WITH STENT PLACEMENT  2013    There were no vitals filed for this visit.  Subjective Assessment - 09/17/18 1508    Subjective  Pt felt so relaxed after last session. Pt feels better. Doing the exercises"make all the difference in the world". Pt feels increased L shoulder pain after baking for 4 days straight. Pt points to the point of pain is located at base of upper trap and it runs up her neck and edge of her shoulder blade.      Pertinent History  2 c-sections, neck surgery C3,  comorbidities.  Performs water aerobics 2 x week ( 2hr each), walk 1 mile without a stretching routine    Patient Stated Goals  relief of L shoulder pain, pelvic pain, and not peeing when walking in the house.          [facility] PT Assessment - 09/17/18 1509      Observation/Other  Assessments   Observations  less forward head       Strength   Overall Strength Comments  shoulder flex/ ext 4-/5 L        Palpation   Spinal mobility  singnificant tightness at L T7-8 intercostals L, levator, rhomoboids, lower trap , L interspinals     Palpation comment  no scapular dyskinesis                    [facility] Adult PT Treatment/Exercise - 09/17/18 1541      Therapeutic Activites    Therapeutic Activities  --   log rolling technique     Exercises   Exercises  --   see pt instructions     Moist Heat Therapy   Number Minutes Moist Heat  5 Minutes    Moist Heat Location  --   L shoulder/ thoracic      Manual Therapy   Manual therapy comments  STM/MWM at problem areas noted in assessment 5                  PT Long Term Goals - 09/03/18 1551      PT LONG TERM GOAL #1   Title  Pt will decrease ODI score from 40% to <20 % in order to perform household chores with less pain     Time  8    Period  Weeks    Status  New      PT LONG TERM GOAL #2   Title  Pt will decrease her NDI score from  24% to  < 12 % in order to rollonto her L side and sleep sidelying      Time  8    Period  Weeks    Status  New      PT LONG TERM GOAL #3   Title  Pt will decrease PFDI from 24% to <12 % in order to restore pelvic floor function    Time  12    Period  Weeks    Status  New      PT LONG TERM GOAL #4   Title  Pt will demo decrease abdominal scar restrictions and demo proper pelvic floor coordination, ROM in order to decrease urinary frequency and urgency      Time  4    Period  Weeks    Status  New      PT LONG TERM GOAL #5   Title  Pt will demo no L SIJ hpomobility, no thoracic hypomobility in order to progress to pelvic floor exercises     Time  2    Period  Weeks    Status  New      PT LONG TERM GOAL #6   Title  Pt will be IND with mobility HEP     Time  10    Period  Weeks    Status  New      PT LONG TERM GOAL #7   Title  Pt will report  decreased urinary frequency from 2 x / 2 hour to < 1x / hr and being able to make it to the toilet without leaking in order to improve QOL     Time  6    Period  Weeks    Status  New      PT LONG TERM GOAL #8   Title  Pt will report more consistent bowel movements from once to every 3 days to everyday / every other day across 2 weeks and improved Stool consistency Stool Type 5-7 across 70% of the time, Stool Type 4  30% of the time  to < 30% of the time, Stool Type 4 70% of the time      Time  12    Period  Weeks    Status  New      PT LONG TERM GOAL  #10   TITLE  Pt will demo increased angle at C/T junction from 130 deg to > 150 deg in order to demo upright posture and decreased upper neck / shoulder mm tensions for optimal diaphragmatic excursion and pelvic floor function    Time  12    Period  Weeks    Status  New            Plan - 09/17/18 1546    Clinical Impression Statement  Pt demo'd improved posture, decreased thoracic mm tightness. Focused on L shoulder/ neck pain which was attributed to tight levator scapulae, rhomoboids, upper trap/lower trap, intercostals on L. Manual Tx decreased these areas of tightness and pt achieved shoulder flexion from 150 deg to 180 deg. Pt still has elbow pain when raising arm overhead in supine. Pt was able  to perform full log rolling with shoulder flexion instead of excessive cervical/ thoracic flexion.  Pt continues to benefit from skilled PT      Rehab Potential  Good    PT Frequency  1x / week    PT Duration  12 weeks    PT Treatment/Interventions  Moist Heat;Functional mobility training;Stair training;Therapeutic activities;Therapeutic exercise;Neuromuscular re-education;Scar mobilization;Taping;Electrical Stimulation;Biofeedback;Aquatic Therapy    Consulted and Agree with Plan of Care  Patient       Patient will benefit from skilled therapeutic intervention in order to improve the following deficits and impairments:  Increased muscle  spasms, Increased fascial restricitons, Decreased scar mobility, Hypomobility, Impaired flexibility, Improper body mechanics, Postural dysfunction, Decreased strength, Decreased mobility, Pain, Decreased coordination, Decreased endurance, Decreased activity tolerance, Decreased balance, Decreased range of motion  Visit Diagnosis: Chronic left shoulder pain  Chronic midline low back pain, unspecified whether sciatica present  Muscle spasm of back     Problem List There are no active problems to display for this patient.   Mariane MastersYeung,Shin Yiing ,PT, DPT, E-RYT  09/17/2018, 3:51 PM   [facility] [address] [place], [place], [address] Phone: [phone]   Fax:  [phone]  Name: Taylor Norris MRN: [id number] Date of Birth: 06/14/1956

## 2018-10-01 ENCOUNTER — Ambulatory Visit: Payer: Medicare HMO | Admitting: Physical Therapy

## 2018-10-01 DIAGNOSIS — M545 Low back pain, unspecified: Secondary | ICD-10-CM

## 2018-10-01 DIAGNOSIS — M62830 Muscle spasm of back: Secondary | ICD-10-CM

## 2018-10-01 DIAGNOSIS — M25512 Pain in left shoulder: Secondary | ICD-10-CM

## 2018-10-01 DIAGNOSIS — G8929 Other chronic pain: Secondary | ICD-10-CM

## 2018-10-01 NOTE — Therapy (Addendum)
[place] [facility] [address] [place], [place], [address] Phone: [phone]   Fax:  [phone]  Physical Therapy Treatment  Patient Details  Name: Taylor MoutonSherrilynn Warnick MRN: [id number] Date of Birth: 01/22/1956 Referring Provider (PT): Dalbert GarnetBeasley   Encounter Date: 10/01/2018  PT End of Session - 10/01/18 1615    Visit Number  6    Number of Visits  12    Date for PT Re-Evaluation  11/05/18    Authorization Type  6/10 medicare    PT Start Time  1515    PT Stop Time  1616    PT Time Calculation (min)  61 min    Activity Tolerance  Patient tolerated treatment well    Behavior During Therapy  [facility] for tasks assessed/performed       Past Medical History:  Diagnosis Date  . Anemia   . Coronary artery disease   . Depression   . Diabetes mellitus without complication (HCC)   . Hypercholesteremia   . Hypertension   . Myocardial infarction (HCC)   . Vitamin D deficiency     Past Surgical History:  Procedure Laterality Date  . BACK SURGERY     c3 disc repair  . c section x2    . COLONOSCOPY WITH PROPOFOL N/A 02/07/2016   Procedure: COLONOSCOPY WITH PROPOFOL;  Surgeon: Wallace CullensPaul Y Oh, MD;  [facility];  Service: Gastroenterology;  Laterality: N/A;  . CORONARY ANGIOPLASTY WITH STENT PLACEMENT  2013    There were no vitals filed for this visit.  Subjective Assessment - 10/01/18 1606    Subjective  Pt reports feeling sore after last session but it went away. Pt has been sleeping like a rock. Pt had numbness in 2nd toe of R foot for one week.     Pertinent History  2 c-sections, neck surgery C3,  comorbidities.  Performs water aerobics 2 x week ( 2hr each), walk 1 mile without a stretching routine    Patient Stated Goals  relief of L shoulder pain, pelvic pain, and not peeing when walking in the house.          [facility] - 10/01/18 1609      Sensation   Light Touch  --   sensation dimished (not  dermotomal, more      Palpation   Palpation comment  tightness and tenderness over C -section scar R > L.   stiffness at B DF , tightness extensor retinaculum of R foot between 2-3, 3-4th ray, point tenderness at 3-4 th ray ( no tenderness /pain post Tx)                   Pelvic Floor Special Questions - 10/01/18 1609    External Palpation  tightness and tenderenss at ischiocavernosus R and deep transverse perineal  ,          [facility] Adult PT Treatment/Exercise - 10/01/18 1610      Neuro Re-ed    Neuro Re-ed Details   see pt instructions      Moist Heat Therapy   Number Minutes Moist Heat  5 Minutes    Moist Heat Location  --   perineal area      Manual Therapy   Manual therapy comments  fascial release at C section area, suprapubic area,  along with pelvic floor R and R foot noted in assessment  PT Long Term Goals - 09/03/18 1551      PT LONG TERM GOAL #1   Title  Pt will decrease ODI score from 40% to <20 % in order to perform household chores with less pain     Time  8    Period  Weeks    Status  New      PT LONG TERM GOAL #2   Title  Pt will decrease her NDI score from  24% to  < 12 % in order to rollonto her L side and sleep sidelying      Time  8    Period  Weeks    Status  New      PT LONG TERM GOAL #3   Title  Pt will decrease PFDI from 24% to <12 % in order to restore pelvic floor function    Time  12    Period  Weeks    Status  New      PT LONG TERM GOAL #4   Title  Pt will demo decrease abdominal scar restrictions and demo proper pelvic floor coordination, ROM in order to decrease urinary frequency and urgency      Time  4    Period  Weeks    Status  New      PT LONG TERM GOAL #5   Title  Pt will demo no L SIJ hpomobility, no thoracic hypomobility in order to progress to pelvic floor exercises     Time  2    Period  Weeks    Status  New      PT LONG TERM GOAL #6   Title  Pt will be IND with mobility HEP      Time  10    Period  Weeks    Status  New      PT LONG TERM GOAL #7   Title  Pt will report decreased urinary frequency from 2 x / 2 hour to < 1x / hr and being able to make it to the toilet without leaking in order to improve QOL     Time  6    Period  Weeks    Status  New      PT LONG TERM GOAL #8   Title  Pt will report more consistent bowel movements from once to every 3 days to everyday / every other day across 2 weeks and improved Stool consistency Stool Type 5-7 across 70% of the time, Stool Type 4  30% of the time  to < 30% of the time, Stool Type 4 70% of the time      Time  12    Period  Weeks    Status  New      PT LONG TERM GOAL  #10   TITLE  Pt will demo increased angle at C/T junction from 130 deg to > 150 deg in order to demo upright posture and decreased upper neck / shoulder mm tensions for optimal diaphragmatic excursion and pelvic floor function    Time  12    Period  Weeks    Status  New            Plan - 10/01/18 1616    Clinical Impression Statement  Pt demo good carry over with decreased upper trap tensions, improved diaphragmatic excursion with proper pelvic floor lengthening. Initiated external pelvic floor assessment which included increased fascial restrictions over C- section scars and R sided pelvic floor mm tensions.  Suspect R sided pelvic floor tightness is associated with R foot tightness and hypomobility. External manual Tx was applied on both areas which improved mobility and decreased tenderness. Plan to apply internal pelvic floor assessment next session. Pt continues to benefit from skilled PT.    Rehab Potential  Good    PT Frequency  1x / week    PT Duration  12 weeks    PT Treatment/Interventions  Moist Heat;Functional mobility training;Stair training;Therapeutic activities;Therapeutic exercise;Neuromuscular re-education;Scar mobilization;Taping;Electrical Stimulation;Biofeedback;Aquatic Therapy    Consulted and Agree with Plan of Care   Patient       Patient will benefit from skilled therapeutic intervention in order to improve the following deficits and impairments:  Increased muscle spasms, Increased fascial restricitons, Decreased scar mobility, Hypomobility, Impaired flexibility, Improper body mechanics, Postural dysfunction, Decreased strength, Decreased mobility, Pain, Decreased coordination, Decreased endurance, Decreased activity tolerance, Decreased balance, Decreased range of motion  Visit Diagnosis: Chronic midline low back pain, unspecified whether sciatica present  Chronic left shoulder pain  Muscle spasm of back     Problem List There are no active problems to display for this patient.   Mariane Masters ,PT, DPT, E-RYT  10/01/2018, 4:18 PM  [place] [facility] MAIN [facility] SERVICES [address] [place], [place], [address] Phone: [phone]   Fax:  [phone]  Name: Shaundrea Carrigg MRN: [id number] Date of Birth: 23-May-1956

## 2018-10-01 NOTE — Patient Instructions (Addendum)
Stretches for pelvic floor   Stretch Pelvic floor      Figure-4 ( crossing ankle over opp thigh with knee bent, foot on bed  5 reps   cross thigh over thigh  5 reps     Happy baby pose  With strap on ballmounds of feet Knees are towards chest and armpit direction, hands holding back of thigh "like baby getting their diaper changed" 5 reps    ----  Feet care :  Self -feet massage   Handshake : fingers between toes, moving ballmounds/toes back and forth several times while other hand anchors at arch. Do the same at the hind/mid foot.  Heel to toes upward to a letter Big Letter T strokes to spread ballmounds and toes, several times, pinch between webs of toes  Run finger tips along top of foot between long bones "comb between the bones"    Wiggle toes and spread them out when relaxing     ___  Abdominal massage upward from L,  R , center to belly button 3 stroke x 3 , pressure is gentle and light with all fingers flat, not using finger tips

## 2019-09-05 ENCOUNTER — Other Ambulatory Visit: Payer: Self-pay

## 2019-09-05 DIAGNOSIS — Z20822 Contact with and (suspected) exposure to covid-19: Secondary | ICD-10-CM

## 2019-09-09 LAB — NOVEL CORONAVIRUS, NAA: SARS-CoV-2, NAA: NOT DETECTED

## 2019-09-12 ENCOUNTER — Telehealth: Payer: Self-pay | Admitting: Internal Medicine

## 2019-09-12 NOTE — Telephone Encounter (Signed)
Pt called to get COVID results.  Made her aware those are negative. °

## 2019-12-23 ENCOUNTER — Other Ambulatory Visit: Payer: Self-pay | Admitting: Internal Medicine

## 2019-12-23 DIAGNOSIS — Z1231 Encounter for screening mammogram for malignant neoplasm of breast: Secondary | ICD-10-CM

## 2020-02-16 ENCOUNTER — Ambulatory Visit
Admission: RE | Admit: 2020-02-16 | Discharge: 2020-02-16 | Disposition: A | Discharge status: Home / Self Care | Payer: Medicare HMO | Source: Ambulatory Visit | Attending: Internal Medicine | Admitting: Internal Medicine

## 2020-02-16 DIAGNOSIS — Z1231 Encounter for screening mammogram for malignant neoplasm of breast: Secondary | ICD-10-CM | POA: Diagnosis not present

## 2020-09-17 ENCOUNTER — Other Ambulatory Visit: Payer: Self-pay | Admitting: Orthopedic Surgery

## 2020-09-17 DIAGNOSIS — S22080A Wedge compression fracture of T11-T12 vertebra, initial encounter for closed fracture: Secondary | ICD-10-CM

## 2020-09-22 ENCOUNTER — Ambulatory Visit
Admission: RE | Admit: 2020-09-22 | Discharge: 2020-09-22 | Disposition: A | Discharge status: Home / Self Care | Payer: Medicare HMO | Source: Ambulatory Visit | Attending: Orthopedic Surgery | Admitting: Orthopedic Surgery

## 2020-09-22 ENCOUNTER — Other Ambulatory Visit: Payer: Self-pay

## 2020-09-22 DIAGNOSIS — S22080A Wedge compression fracture of T11-T12 vertebra, initial encounter for closed fracture: Secondary | ICD-10-CM | POA: Diagnosis not present

## 2021-06-03 ENCOUNTER — Other Ambulatory Visit: Payer: Self-pay | Admitting: Nephrology

## 2021-06-03 DIAGNOSIS — E1122 Type 2 diabetes mellitus with diabetic chronic kidney disease: Secondary | ICD-10-CM

## 2021-06-03 DIAGNOSIS — R809 Proteinuria, unspecified: Secondary | ICD-10-CM

## 2021-06-08 ENCOUNTER — Other Ambulatory Visit: Payer: Self-pay

## 2021-06-08 ENCOUNTER — Ambulatory Visit
Admission: RE | Admit: 2021-06-08 | Discharge: 2021-06-08 | Disposition: A | Discharge status: Home / Self Care | Payer: Medicare HMO | Source: Ambulatory Visit | Attending: Nephrology | Admitting: Nephrology

## 2021-06-08 DIAGNOSIS — R809 Proteinuria, unspecified: Secondary | ICD-10-CM | POA: Diagnosis present

## 2021-06-08 DIAGNOSIS — E1122 Type 2 diabetes mellitus with diabetic chronic kidney disease: Secondary | ICD-10-CM | POA: Insufficient documentation

## 2021-08-22 ENCOUNTER — Other Ambulatory Visit: Payer: Self-pay | Admitting: Internal Medicine

## 2021-08-24 ENCOUNTER — Other Ambulatory Visit: Payer: Self-pay | Admitting: Surgery

## 2021-08-24 DIAGNOSIS — N6452 Nipple discharge: Secondary | ICD-10-CM

## 2021-09-02 ENCOUNTER — Ambulatory Visit
Admission: RE | Admit: 2021-09-02 | Discharge: 2021-09-02 | Disposition: A | Discharge status: Home / Self Care | Payer: Medicare HMO | Source: Ambulatory Visit | Attending: Surgery | Admitting: Surgery

## 2021-09-02 ENCOUNTER — Other Ambulatory Visit: Payer: Self-pay

## 2021-09-02 DIAGNOSIS — N6452 Nipple discharge: Secondary | ICD-10-CM | POA: Insufficient documentation

## 2022-03-02 ENCOUNTER — Emergency Department: Payer: Medicare HMO

## 2022-03-02 ENCOUNTER — Emergency Department
Admission: EM | Admit: 2022-03-02 | Discharge: 2022-03-02 | Disposition: A | Discharge status: Home / Self Care | Payer: Medicare HMO | Attending: Emergency Medicine | Admitting: Emergency Medicine

## 2022-03-02 ENCOUNTER — Other Ambulatory Visit: Payer: Self-pay

## 2022-03-02 DIAGNOSIS — S60211A Contusion of right wrist, initial encounter: Secondary | ICD-10-CM | POA: Diagnosis not present

## 2022-03-02 DIAGNOSIS — W01198A Fall on same level from slipping, tripping and stumbling with subsequent striking against other object, initial encounter: Secondary | ICD-10-CM | POA: Insufficient documentation

## 2022-03-02 DIAGNOSIS — S199XXA Unspecified injury of neck, initial encounter: Secondary | ICD-10-CM | POA: Diagnosis present

## 2022-03-02 DIAGNOSIS — I10 Essential (primary) hypertension: Secondary | ICD-10-CM | POA: Diagnosis not present

## 2022-03-02 DIAGNOSIS — I2510 Atherosclerotic heart disease of native coronary artery without angina pectoris: Secondary | ICD-10-CM | POA: Diagnosis not present

## 2022-03-02 DIAGNOSIS — E119 Type 2 diabetes mellitus without complications: Secondary | ICD-10-CM | POA: Diagnosis not present

## 2022-03-02 DIAGNOSIS — S6991XA Unspecified injury of right wrist, hand and finger(s), initial encounter: Secondary | ICD-10-CM

## 2022-03-02 DIAGNOSIS — S60221A Contusion of right hand, initial encounter: Secondary | ICD-10-CM | POA: Diagnosis not present

## 2022-03-02 DIAGNOSIS — S0083XA Contusion of other part of head, initial encounter: Secondary | ICD-10-CM | POA: Insufficient documentation

## 2022-03-02 DIAGNOSIS — S161XXA Strain of muscle, fascia and tendon at neck level, initial encounter: Secondary | ICD-10-CM | POA: Insufficient documentation

## 2022-03-02 DIAGNOSIS — W19XXXA Unspecified fall, initial encounter: Secondary | ICD-10-CM

## 2022-03-02 DIAGNOSIS — S0990XA Unspecified injury of head, initial encounter: Secondary | ICD-10-CM

## 2022-03-02 LAB — COMPREHENSIVE METABOLIC PANEL
ALT: 25 U/L (ref 0–44)
AST: 24 U/L (ref 15–41)
Albumin: 3.8 g/dL (ref 3.5–5.0)
Alkaline Phosphatase: 80 U/L (ref 38–126)
Anion gap: 5 (ref 5–15)
BUN: 30 mg/dL — ABNORMAL HIGH (ref 8–23)
CO2: 25 mmol/L (ref 22–32)
Calcium: 9 mg/dL (ref 8.9–10.3)
Chloride: 110 mmol/L (ref 98–111)
Creatinine, Ser: 1.31 mg/dL — ABNORMAL HIGH (ref 0.44–1.00)
GFR, Estimated: 45 mL/min — ABNORMAL LOW (ref >60)
Glucose, Bld: 111 mg/dL — ABNORMAL HIGH (ref 70–99)
Potassium: 4.4 mmol/L (ref 3.5–5.1)
Sodium: 140 mmol/L (ref 135–145)
Total Bilirubin: 0.7 mg/dL (ref 0.3–1.2)
Total Protein: 6.7 g/dL (ref 6.5–8.1)

## 2022-03-02 LAB — URINALYSIS, ROUTINE W REFLEX MICROSCOPIC
Bacteria, UA: NONE SEEN
Bilirubin Urine: NEGATIVE
Glucose, UA: >=500 mg/dL — AB
Hgb urine dipstick: NEGATIVE
Ketones, ur: NEGATIVE mg/dL
Leukocytes,Ua: NEGATIVE
Nitrite: NEGATIVE
Protein, ur: NEGATIVE mg/dL
Specific Gravity, Urine: 1.002 — ABNORMAL LOW (ref 1.005–1.030)
pH: 5 (ref 5.0–8.0)

## 2022-03-02 LAB — CBC WITH DIFFERENTIAL/PLATELET
Abs Immature Granulocytes: 0.05 10*3/uL (ref 0.00–0.07)
Basophils Absolute: 0.1 10*3/uL (ref 0.0–0.1)
Basophils Relative: 1 %
Eosinophils Absolute: 0.1 10*3/uL (ref 0.0–0.5)
Eosinophils Relative: 2 %
HCT: 42.2 % (ref 36.0–46.0)
Hemoglobin: 13.8 g/dL (ref 12.0–15.0)
Immature Granulocytes: 1 %
Lymphocytes Relative: 24 %
Lymphs Abs: 1.9 10*3/uL (ref 0.7–4.0)
MCH: 32.6 pg (ref 26.0–34.0)
MCHC: 32.7 g/dL (ref 30.0–36.0)
MCV: 99.8 fL (ref 80.0–100.0)
Monocytes Absolute: 0.7 10*3/uL (ref 0.1–1.0)
Monocytes Relative: 10 %
Neutro Abs: 4.9 10*3/uL (ref 1.7–7.7)
Neutrophils Relative %: 62 %
Platelets: 179 10*3/uL (ref 150–400)
RBC: 4.23 MIL/uL (ref 3.87–5.11)
RDW: 12.9 % (ref 11.5–15.5)
WBC: 7.7 10*3/uL (ref 4.0–10.5)
nRBC: 0 % (ref 0.0–0.2)

## 2022-03-02 LAB — TROPONIN I (HIGH SENSITIVITY): Troponin I (High Sensitivity): 6 ng/L (ref <18)

## 2022-03-02 MED ORDER — ONDANSETRON 4 MG PO TBDP
4.0000 mg | ORAL_TABLET | Freq: Once | ORAL | Status: AC
  Administered 2022-03-02: 4 mg via ORAL
  Filled 2022-03-02: qty 1

## 2022-03-02 MED ORDER — TRAMADOL HCL 50 MG PO TABS: 50.0000 mg | ORAL_TABLET | Freq: Two times a day (BID) | ORAL | 0 refills | Status: DC | PRN

## 2022-03-02 MED ORDER — TRAMADOL HCL 50 MG PO TABS
50.0000 mg | ORAL_TABLET | Freq: Once | ORAL | Status: AC
  Administered 2022-03-02: 50 mg via ORAL
  Filled 2022-03-02: qty 1

## 2022-03-02 NOTE — ED Triage Notes (Signed)
Pt comes into the ED via wheelchair from [facility] with c/o trip and fall today hitting her head, unsure if blacked out. And torn up the right wrist splint she had from a previous fall. Pt is a/ox4 at present.

## 2022-03-02 NOTE — Discharge Instructions (Signed)
Please call and schedule follow-up appointment with your primary care provider as well as your cardiologist.  Return to the emergency department for any symptom of concern if unable to schedule an appointment.

## 2022-03-02 NOTE — ED Provider Notes (Signed)
[facility] Provider Note    Event Date/Time   First MD Initiated Contact with Patient 03/02/22 1545     (approximate)   History   Loss of Consciousness and Fall   HPI  Taylor Norris is a 66 y.o. female with history as listed below presents to the emergency department for treatment and evaluation after falling today.  She is unable to recall the specifics of the fall but states that she was near [employer] and tumbled head first onto the sidewalk. She is unsure if she lost consciousness. She rolled off the curb onto the street and used a parked vehicle to get up. She then drove home which is about a mile away. She had been evaluated yesterday for bruising and pain without injury to her right hand and wrist and was placed in a splint. The splint was bent and torn after her fall today and she now has a knot on the hand. She is also complaining of neck and facial pain.   Past Medical History:  Diagnosis Date   Anemia    Coronary artery disease    Depression    Diabetes mellitus without complication (HCC)    Hypercholesteremia    Hypertension    Myocardial infarction [facility])    Vitamin D deficiency         Physical Exam   Triage Vital Signs: ED Triage Vitals  Enc Vitals Group     BP 03/02/22 1426 (!) 156/59     Pulse Rate 03/02/22 1426 (!) 51     Resp 03/02/22 1426 16     Temp 03/02/22 1426 98.5 F (36.9 C)     Temp Source 03/02/22 1426 Oral     SpO2 03/02/22 1426 97 %     Weight 03/02/22 1425 200 lb (90.7 kg)     Height 03/02/22 1425 5\' 4"  (1.626 m)     Head Circumference --      Peak Flow --      Pain Score --      Pain Loc --      Pain Edu? --      Excl. in GC? --     Most recent vital signs: Vitals:   03/02/22 1426 03/02/22 1634  BP: (!) 156/59 (!) 162/58  Pulse: (!) 51 62  Resp: 16 17  Temp: 98.5 F (36.9 C)   SpO2: 97% 97%    General: Awake, no distress.  CV:  Good peripheral perfusion.  Resp:  Normal effort.   Abd:  No distention.  Other:  Abrasion to the frontal scalp; tenderness to the right cheek/face; diffuse tenderness to paracervical muscles; tenderness and bruising over right hand and wrist with focal tenderness over proximal 5th metacarpal.   ED Results / Procedures / Treatments   Labs (all labs ordered are listed, but only abnormal results are displayed) Labs Reviewed  URINALYSIS, ROUTINE W REFLEX MICROSCOPIC - Abnormal; Notable for the following components:      Result Value   Color, Urine COLORLESS (*)    APPearance CLEAR (*)    Specific Gravity, Urine 1.002 (*)    Glucose, UA >=500 (*)    All other components within normal limits  COMPREHENSIVE METABOLIC PANEL - Abnormal; Notable for the following components:   Glucose, Bld 111 (*)    BUN 30 (*)    Creatinine, Ser 1.31 (*)    GFR, Estimated 45 (*)    All other components within normal limits  CBC WITH  DIFFERENTIAL/PLATELET  TROPONIN I (HIGH SENSITIVITY)  TROPONIN I (HIGH SENSITIVITY)     EKG  ED ECG REPORT I, Telisa Ohlsen, FNP-BC personally viewed and interpreted this ECG.   Date: 03/02/2022  EKG Time: 1642  Rate: 48  Rhythm: sinus bradycardia  Axis: normal  Intervals:none  ST&T Change: no    RADIOLOGY  CT head, cervical spine, and maxillofacial bones are all negative for acute abnormality. X-ray image of the right wrist is negative for acute bony abnormality.  I have independently reviewed and interpreted imaging as well as reviewed report from radiology.  PROCEDURES:  Critical Care performed: No  Procedures   MEDICATIONS ORDERED IN ED:  Medications  traMADol (ULTRAM) tablet 50 mg (50 mg Oral Given 03/02/22 1635)  ondansetron (ZOFRAN-ODT) disintegrating tablet 4 mg (4 mg Oral Given 03/02/22 1635)     IMPRESSION / MDM / ASSESSMENT AND PLAN / ED COURSE   I reviewed the triage vital signs and the nursing notes.  Differential diagnosis includes, but is not limited to: ICH; concussion;  maxillofacial contusion/fracture; hand fracture/sprain  Patient's presentation is most consistent with acute presentation with potential threat to life or bodily function.  The patient is on the cardiac monitor to evaluate for evidence of arrhythmia and/or significant heart rate changes.  66 year old female presenting to the emergency department for treatment and evaluation after sustaining a fall today.  See HPI for further details.  CT of the head and cervical spine was performed while awaiting ER room assignment and are both negative.  Upon further assessment and exam, work-up is indicated as the patient is not sure what caused her to fall and the fall was unwitnessed.  She is also had some bruising to the right hand over the past 24 to 48 hours that is unexplained and not related to any traumatic injury.  Patient currently denies palpitations, shortness of breath, abdominal pain or pain in the lower extremities.  ----------------------------------------- 6:27 PM on 03/02/2022 ----------------------------------------- Imaging is all reassuring.  EKG does not show sinus bradycardia.  The patient denies having felt weak, dizzy, palpitations prior to her fall today.  She was able to reach out with her right hand to break her fall.  Plan for discharge versus admission discussed with the patient and her husband.  Admission was offered for further bradycardia workup, however the patient declines and states that she will call and schedule follow-up appointment with her primary care provider as well as her cardiologist.  ER return precautions were discussed.      FINAL CLINICAL IMPRESSION(S) / ED DIAGNOSES   Final diagnoses:  Fall, initial encounter  Minor head injury, initial encounter  Forehead contusion, initial encounter  Wrist injury, right, initial encounter  Acute strain of neck muscle, initial encounter     Note:  This document was prepared using Dragon voice recognition software and  may include unintentional dictation errors.   Chinita Pester, FNP 03/02/22 Izola Price    [place], MD 03/02/22 [phone]

## 2022-03-02 NOTE — ED Notes (Signed)
Pt states she tripped over her feet and fell, hitting her head- pt states she is having a lot of pain in her eyes- per visitor, this is not pt's only recent fall

## 2022-09-27 IMAGING — CT CT CERVICAL SPINE W/O CM
3 of 4 series · 12 of 35 positions shown, 14 images · non-contrast
Comparison: None

CLINICAL DATA: Fell with trauma to the forehead. Headache and neck
pain.



[Series 5: sag bone · sagittal · 0.25mm/px · 5 of 76 slices shown, 6 images]
[im 26/76  bone]
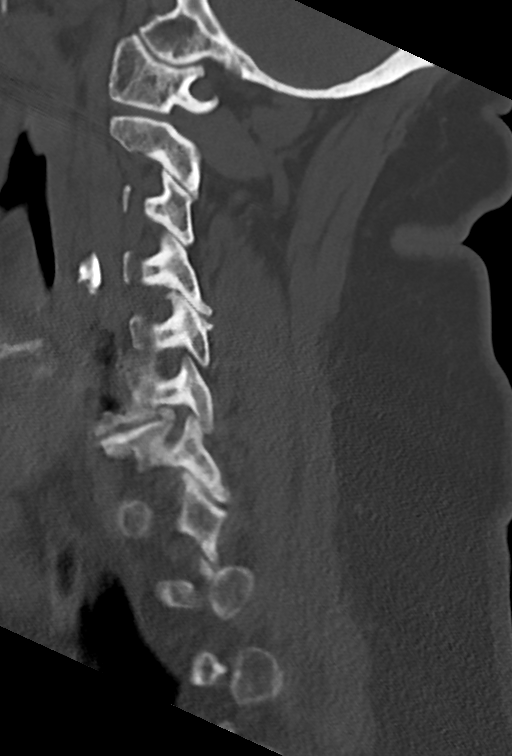
[im 32/76  bone]
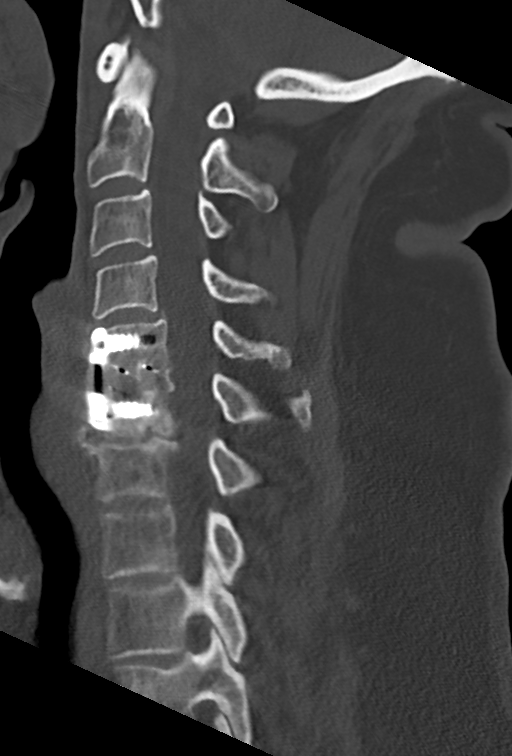
[im 38/76  soft-tissue]
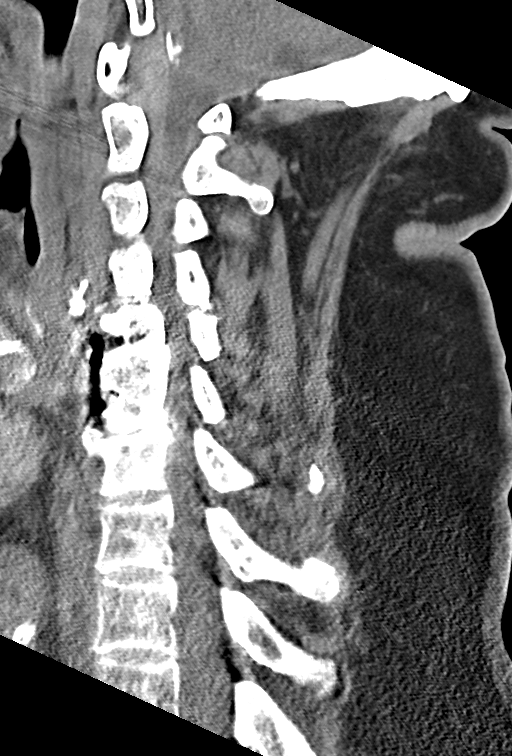
[im 38/76  bone]
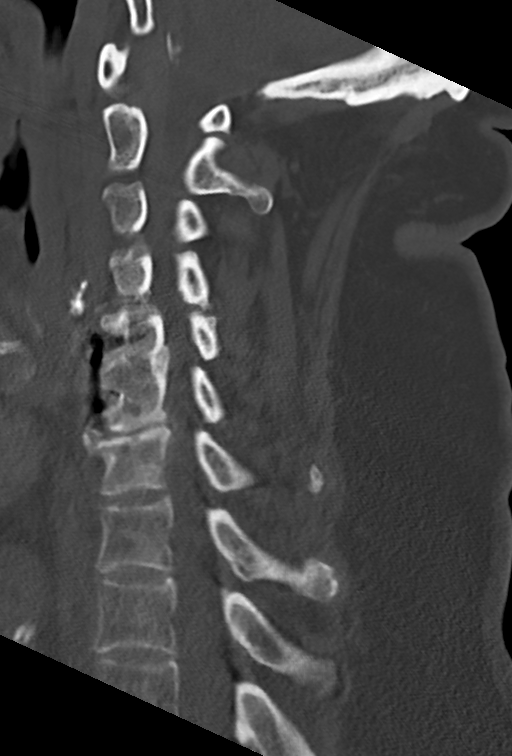
[im 44/76  bone]
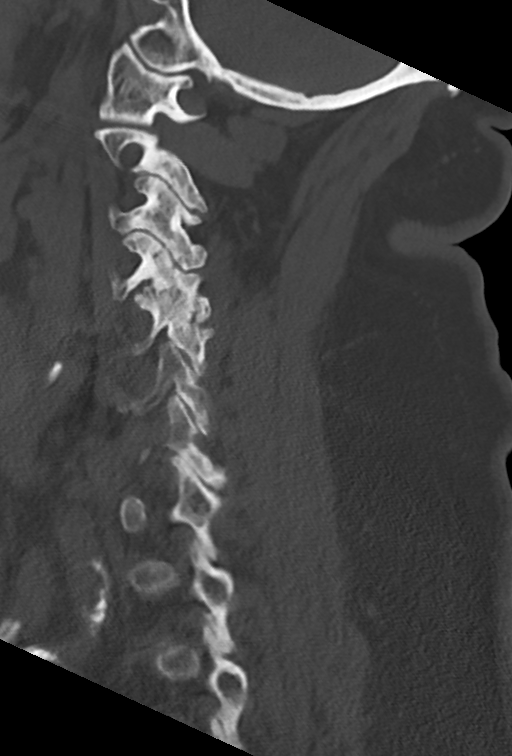
[im 51/76  bone]
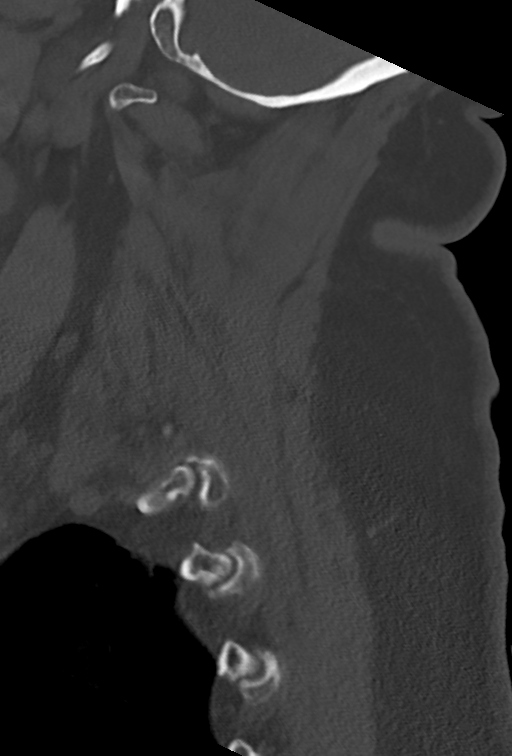

[Series 6: cor bone · coronal · 0.30mm/px · 3 of 64 slices shown]
[im 14/64  bone]
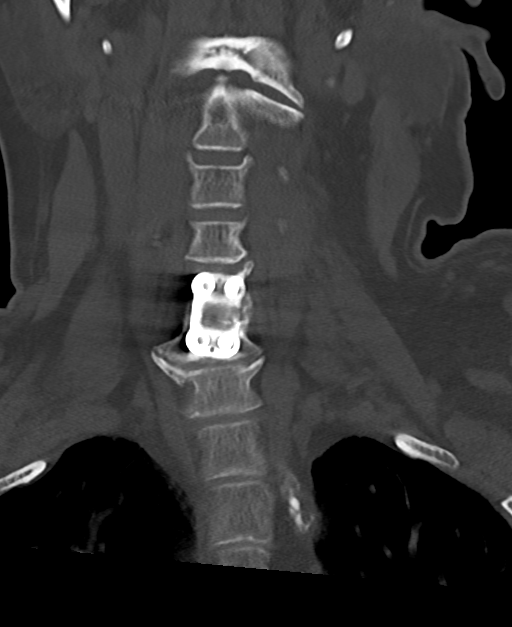
[im 26/64  bone]
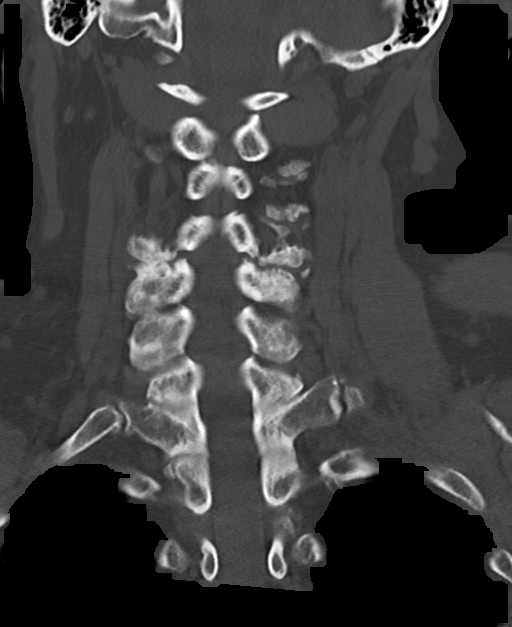
[im 38/64  bone]
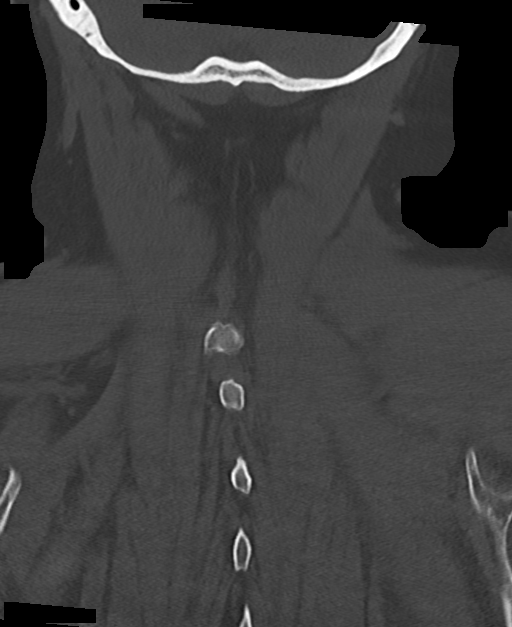

[Series 7: orthogonal axials · axial · 0.25mm/px · z∈[-252,-140]mm · 4 of 94 slices shown, 5 images]
[im 16/94  soft-tissue]
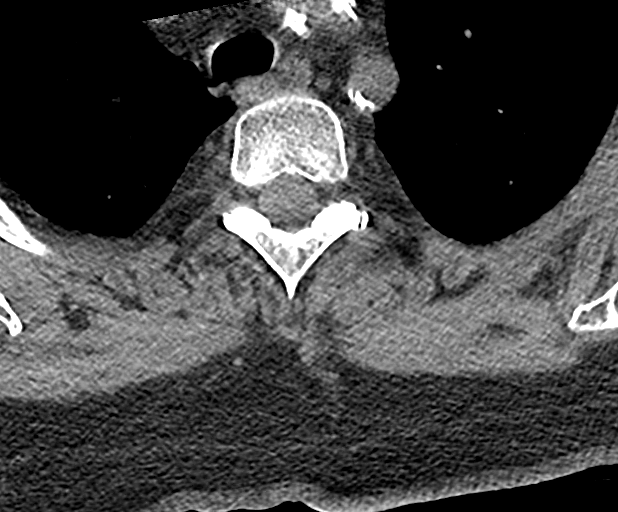
[im 16/94  bone]
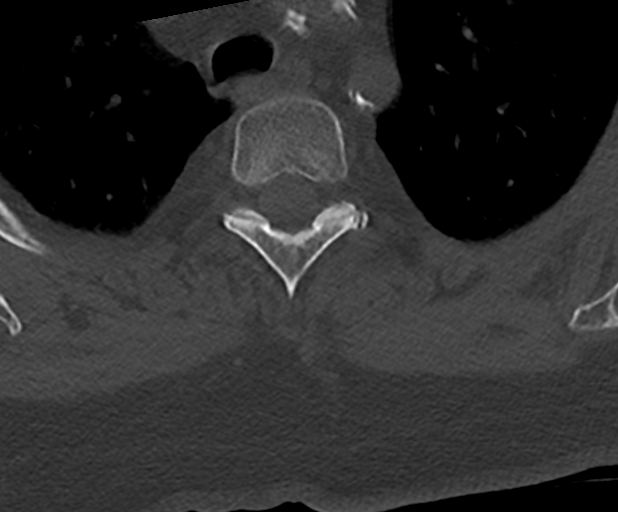
[im 32/94  bone]
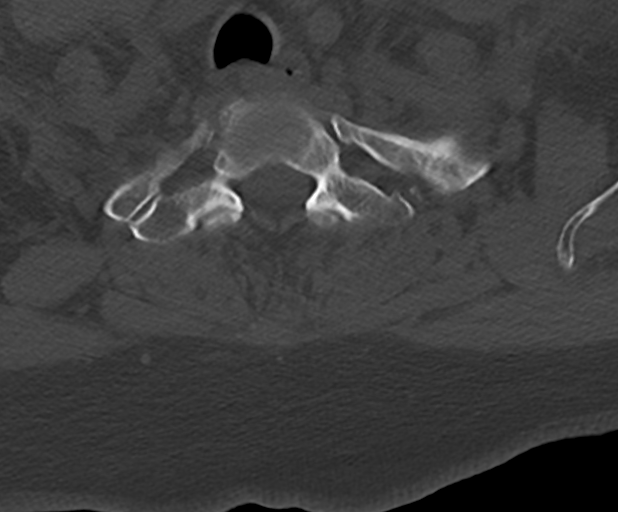
[im 63/94  bone]
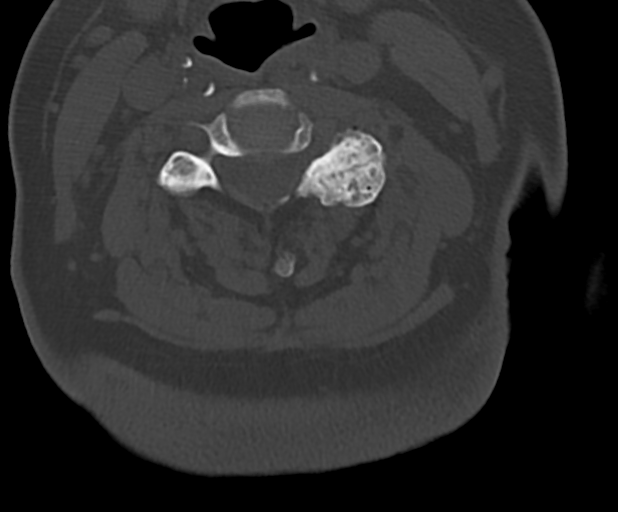
[im 78/94  bone]
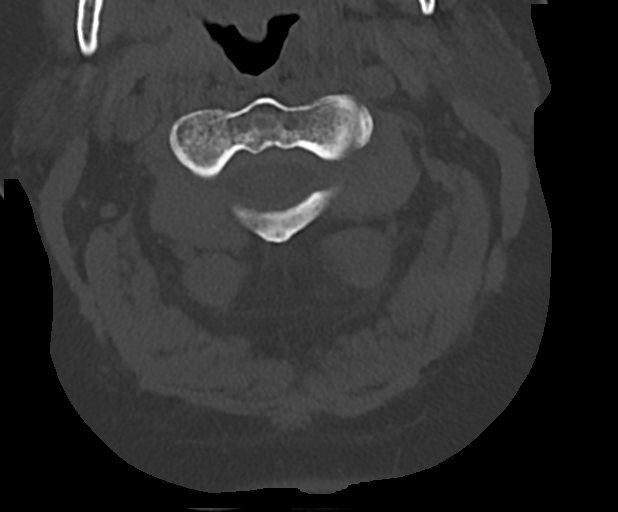

[12 of 35 positions shown; findings below may reference images not displayed]

FINDINGS: Alignment: Straightening of the normal cervical lordosis.

Skull base and vertebrae: No fracture. Distant ACDF C5-6 with solid
union.

Soft tissues and spinal canal: No traumatic finding. Atherosclerotic
change of the carotid arteries.

Disc levels: Previous ACDF C5-6 as noted above with solid union and
sufficient patency of the canal and foramina. Mild degenerative
spondylosis at C4-5 and C6-7. No visible stenosis is C4-5. Foraminal
narrowing at C6-7 right more than left. Facet osteoarthritis most
marked on the right at the C4-5 level and on the left at C2-3, C3-4
and C4-5.

Upper chest: Negative

Other: None
IMPRESSION: No acute or traumatic finding.

Distant ACDF C5-6 has a good appearance.

Degenerative spondylosis, most pronounced at C6-7 with bony
foraminal narrowing right more than left.

Facet osteoarthritis on the right at C4-5 and on the left at C2-3,
C3-4 and C4-5.

## 2022-09-27 IMAGING — CT CT HEAD W/O CM
4 series · 17 of 47 positions shown, 19 images · non-contrast
Comparison: None

CLINICAL DATA: Head trauma, moderate-severe. Tripped and fell today



[Series 2: head bone · axial · 0.40mm/px · z∈[-89,-37]mm · 4 of 76 slices shown]
[im 8/76  bone]
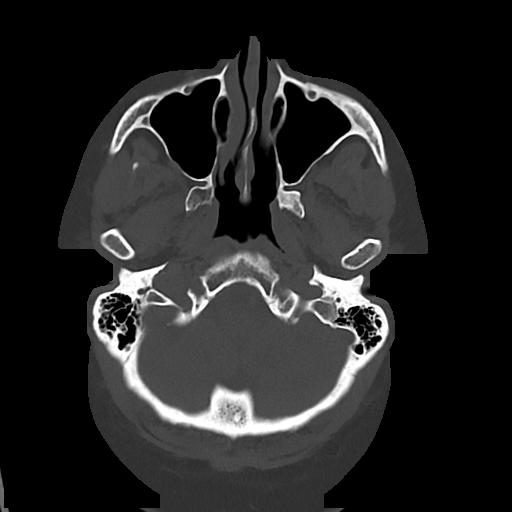
[im 16/76  bone]
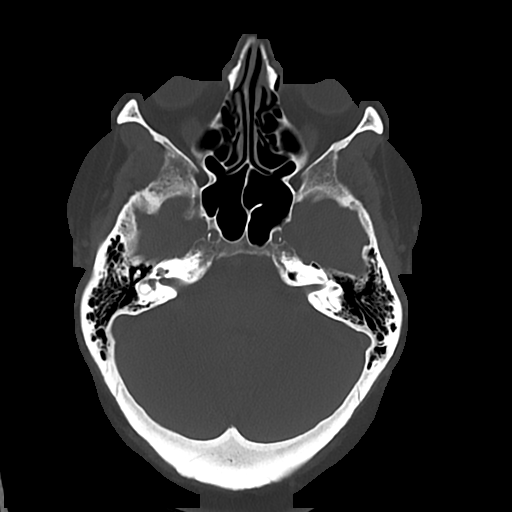
[im 23/76  bone]
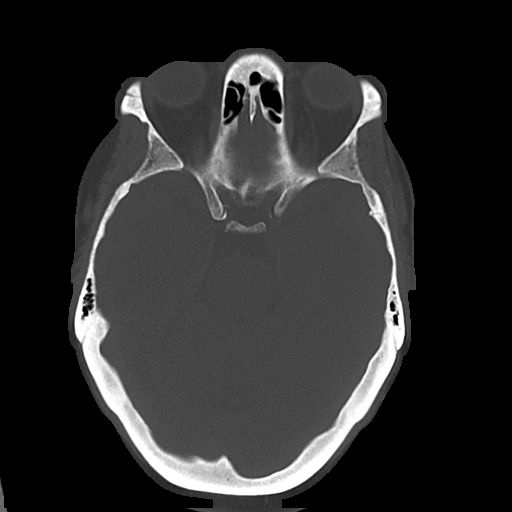
[im 34/76  bone]
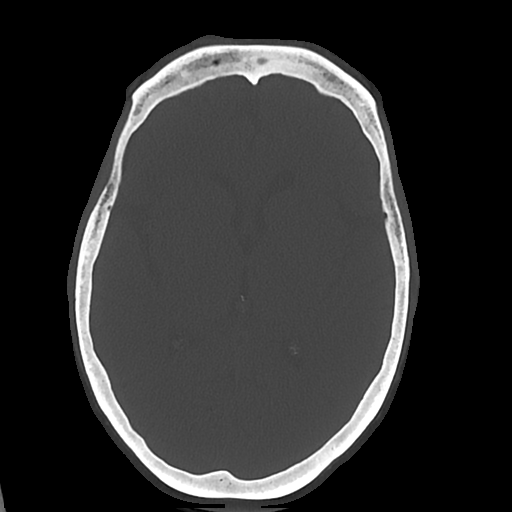

[Series 3: head wo · axial · 0.40mm/px · z∈[-88,+27]mm · 7 of 31 slices shown, 9 images]
[im 4/31  brain]
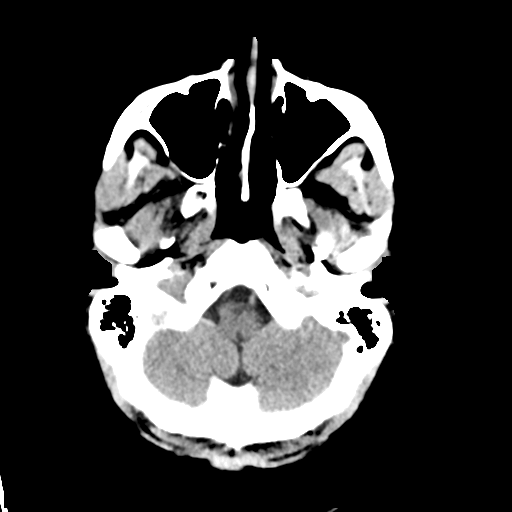
[im 4/31  bone]
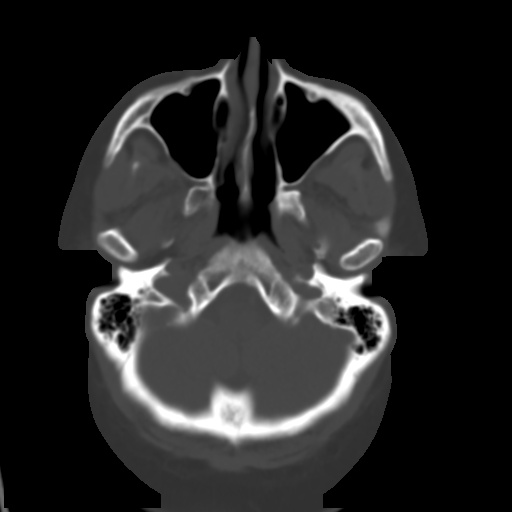
[im 8/31  brain]
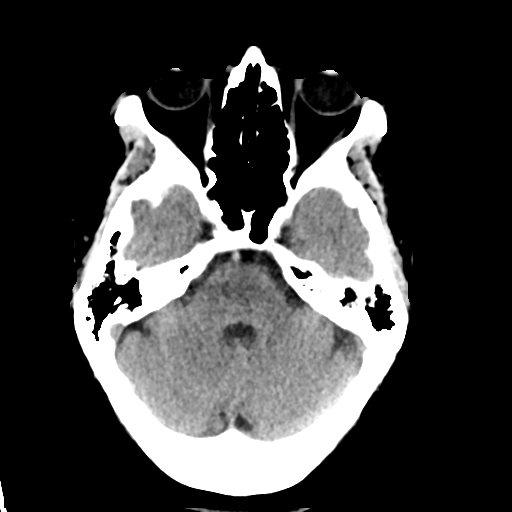
[im 12/31  brain]
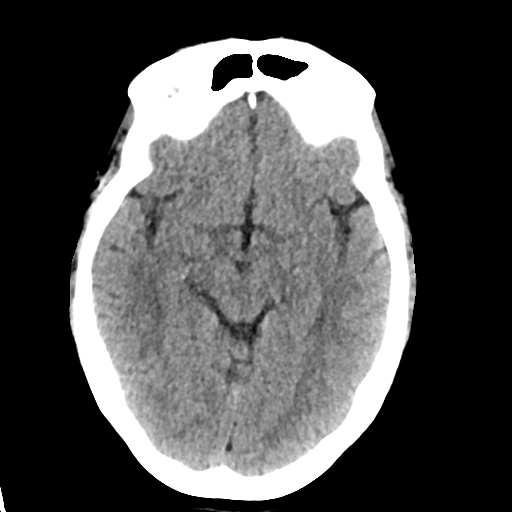
[im 16/31  brain]
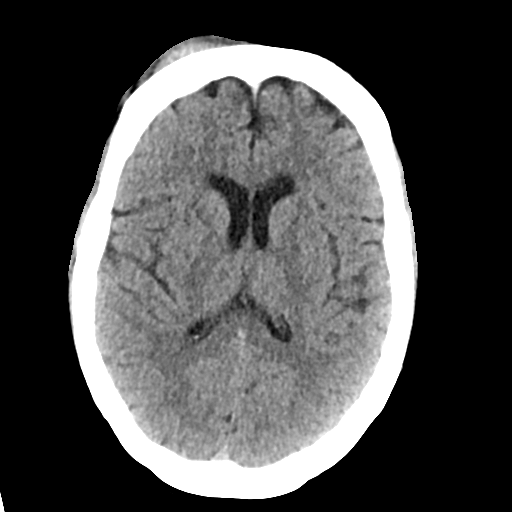
[im 19/31  brain]
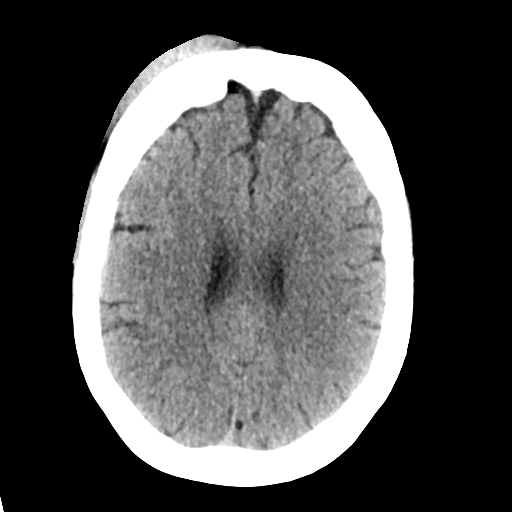
[im 19/31  bone]
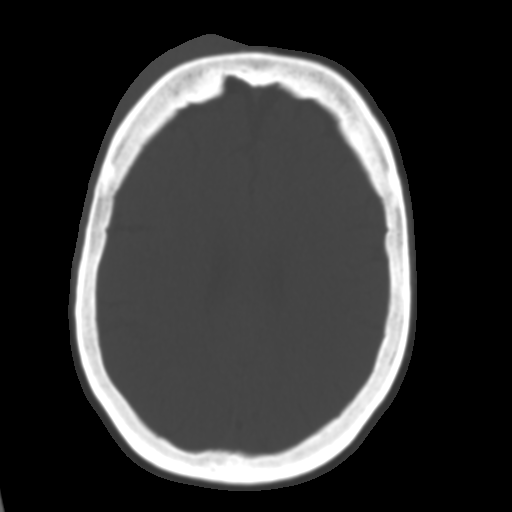
[im 23/31  brain]
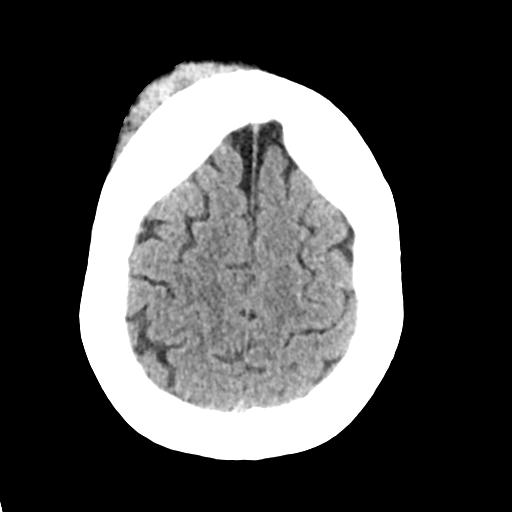
[im 27/31  brain]
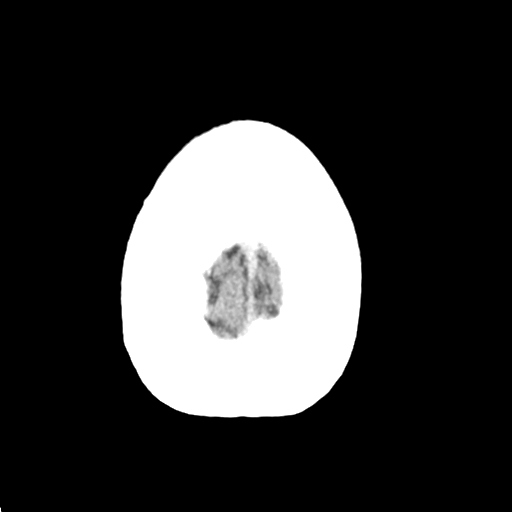

[Series 4: cor soft · coronal · 0.34mm/px · 3 of 73 slices shown]
[im 25/73  brain]
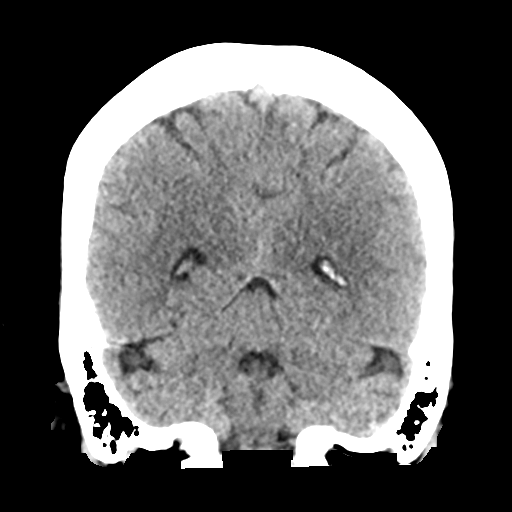
[im 33/73  brain]
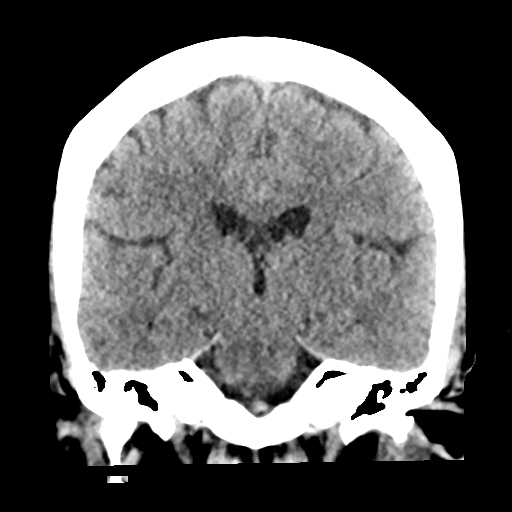
[im 41/73  brain]
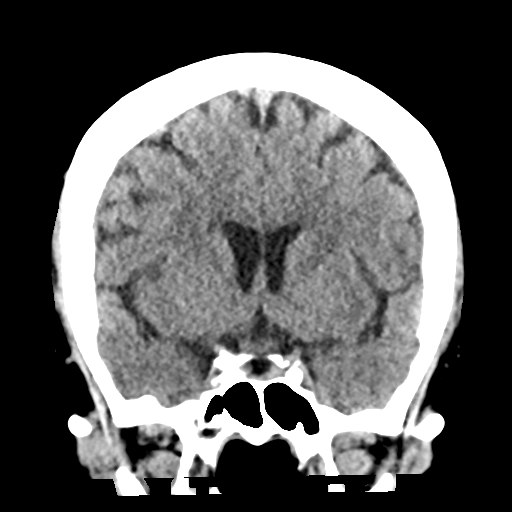

[Series 5: sag soft · sagittal · 0.34mm/px · 3 of 58 slices shown]
[im 20/58  brain]
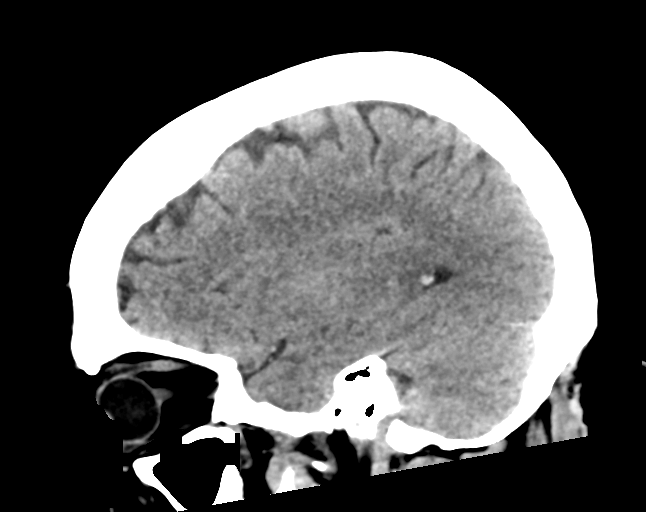
[im 29/58  brain]
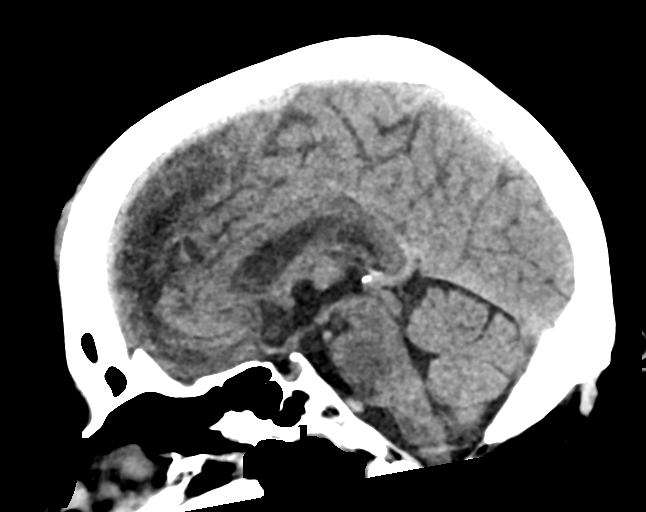
[im 39/58  brain]
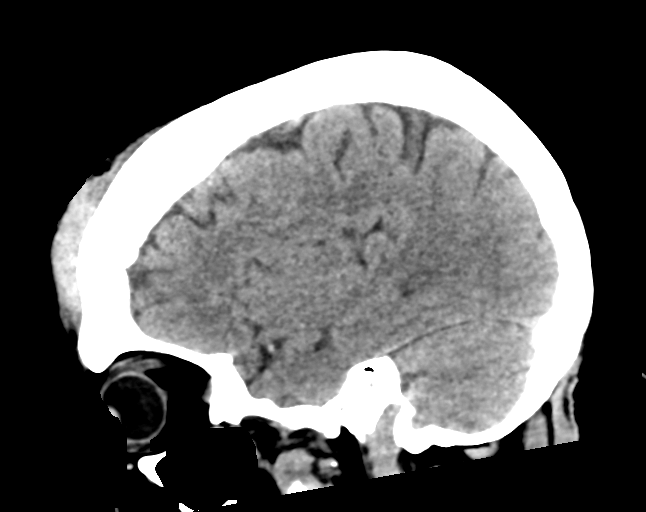

[17 of 47 positions shown; findings below may reference images not displayed]

FINDINGS: Brain: Normal appearance without evidence of atrophy, old or acute
infarction, mass lesion, hemorrhage, hydrocephalus or extra-axial
collection.

Vascular: No abnormal vascular finding.

Skull: No skull fracture.

Sinuses/Orbits: Clear/normal

Other: Right forehead and frontal scalp hematoma.
IMPRESSION: Right forehead and frontal scalp hematoma. No underlying skull
fracture. No intracranial injury. Normal appearance of the brain.

## 2022-09-27 IMAGING — CT CT MAXILLOFACIAL W/O CM
3 of 6 series · 15 of 47 positions shown, 18 images · non-contrast
Comparison: CT head 03/02/2022

CLINICAL DATA: Fall today, striking head. Right frontal scalp
hematoma described on CT.



[Series 2: maxilllofacial 2.0 hr40 3 · axial · 0.33mm/px · z∈[-204,-84]mm · 10 of 70 slices shown, 13 images]
[im 5/70  brain]
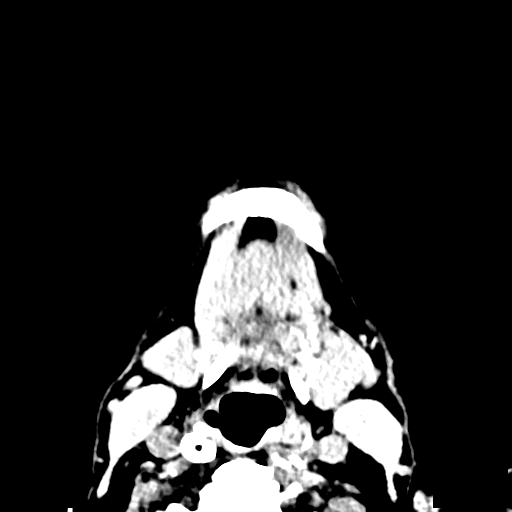
[im 5/70  bone]
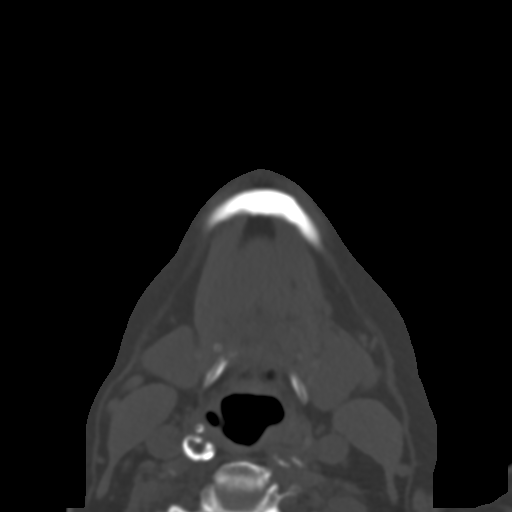
[im 10/70  bone]
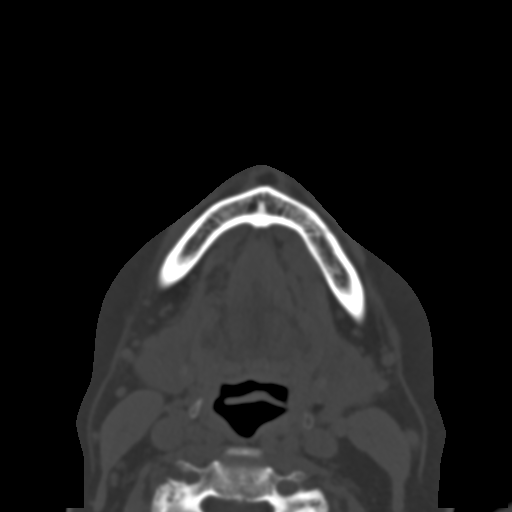
[im 20/70  bone]
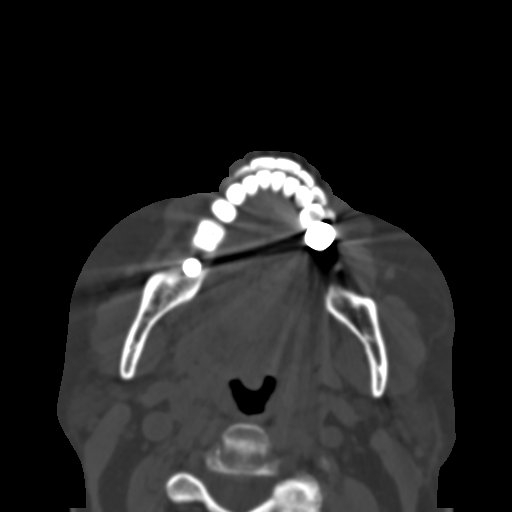
[im 25/70  bone]
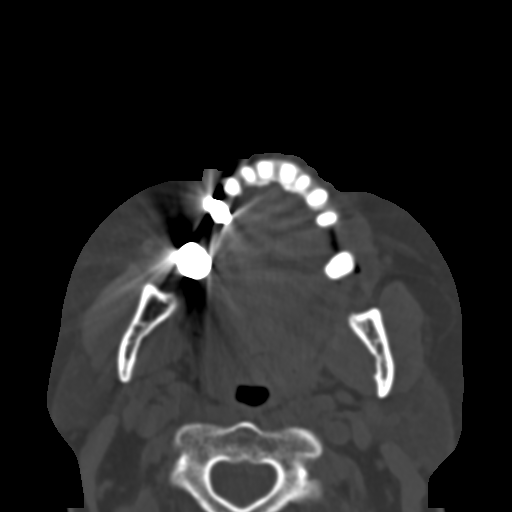
[im 30/70  brain]
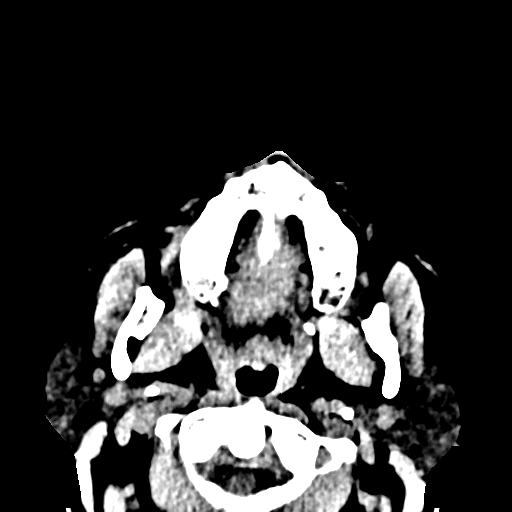
[im 30/70  bone]
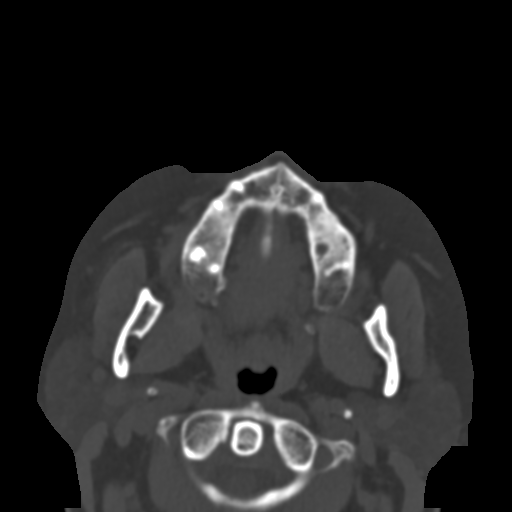
[im 40/70  bone]
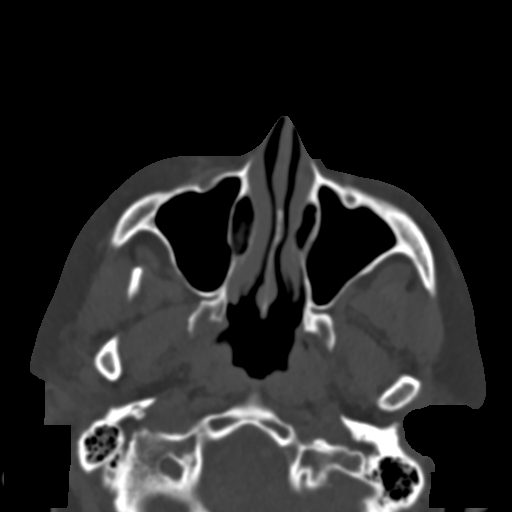
[im 45/70  bone]
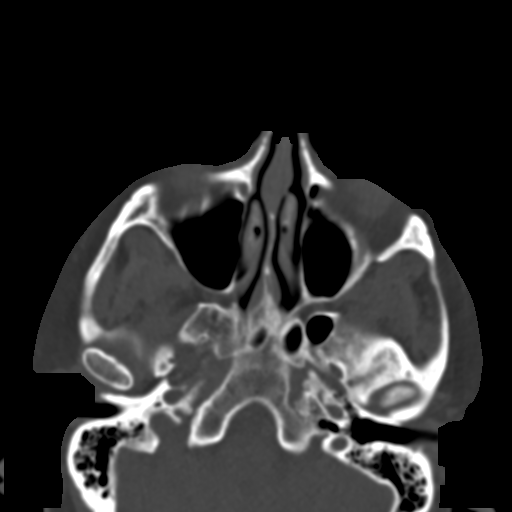
[im 50/70  bone]
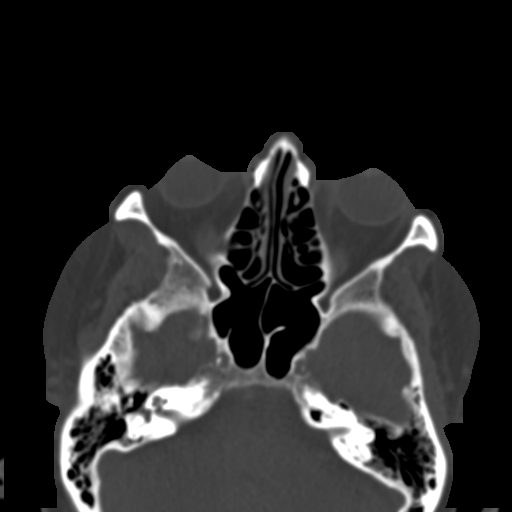
[im 60/70  brain]
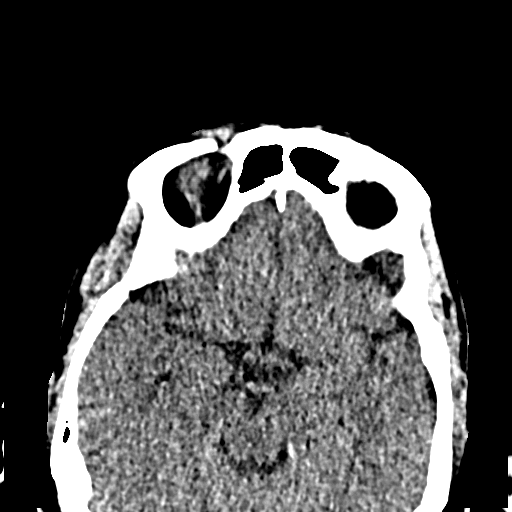
[im 60/70  bone]
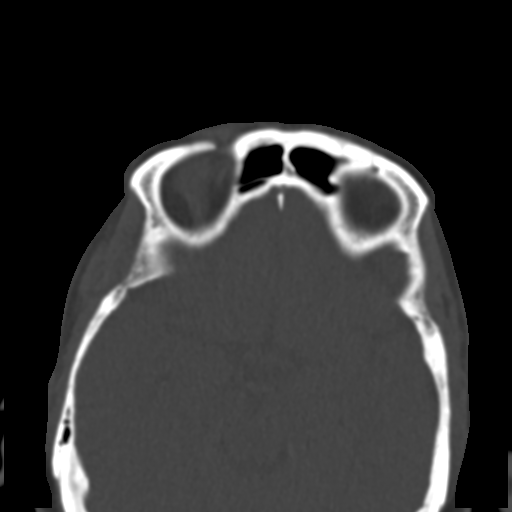
[im 65/70  bone]
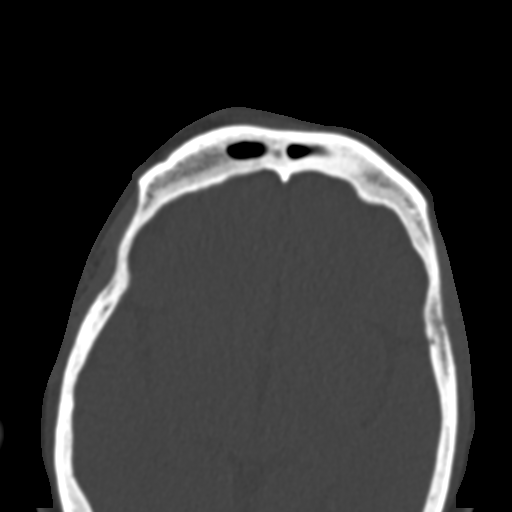

[Series 6: st cor · coronal · 0.30mm/px · 3 of 65 slices shown]
[im 17/65  bone]
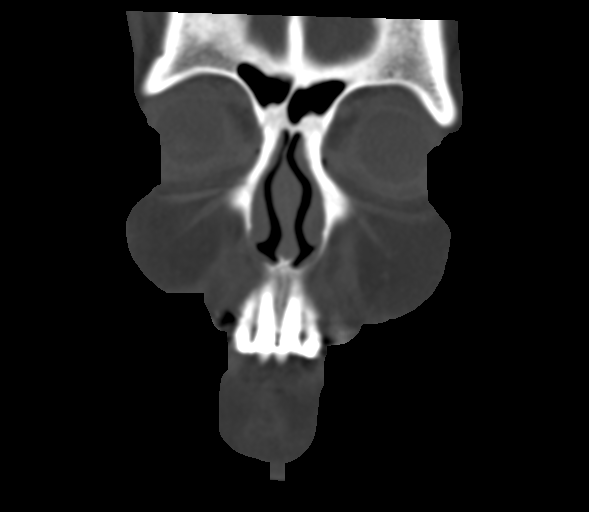
[im 33/65  bone]
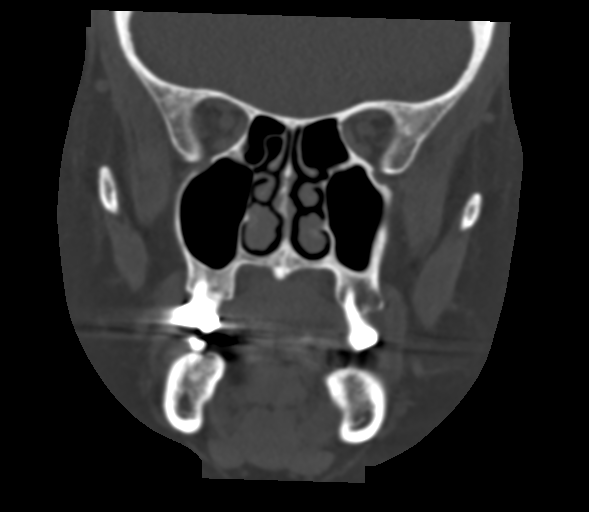
[im 49/65  bone]
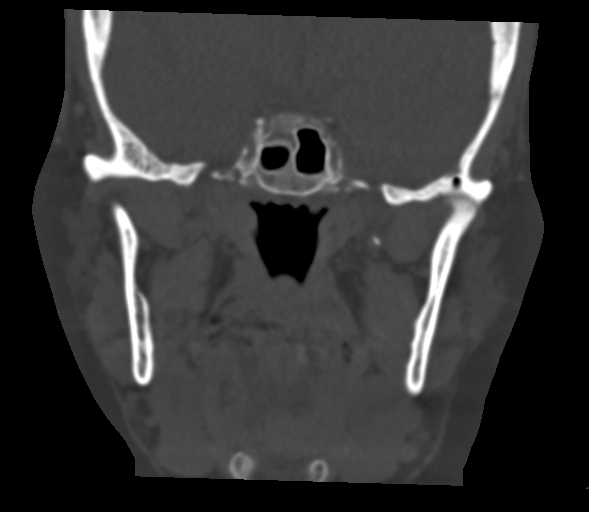

[Series 9: bone sag · sagittal · 0.25mm/px · 2 of 89 slices shown]
[im 30/89  bone]
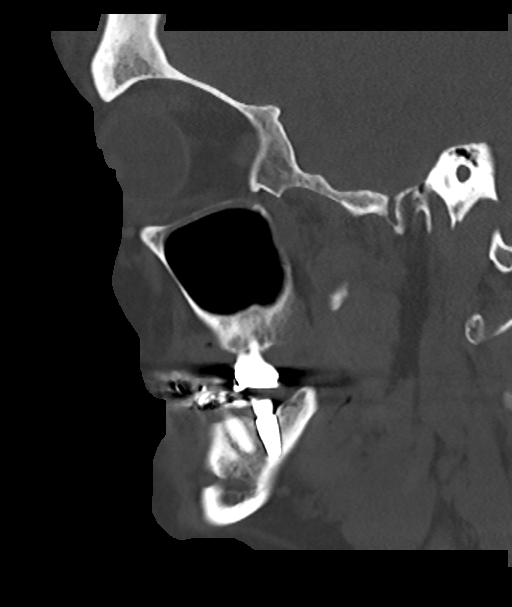
[im 59/89  bone]
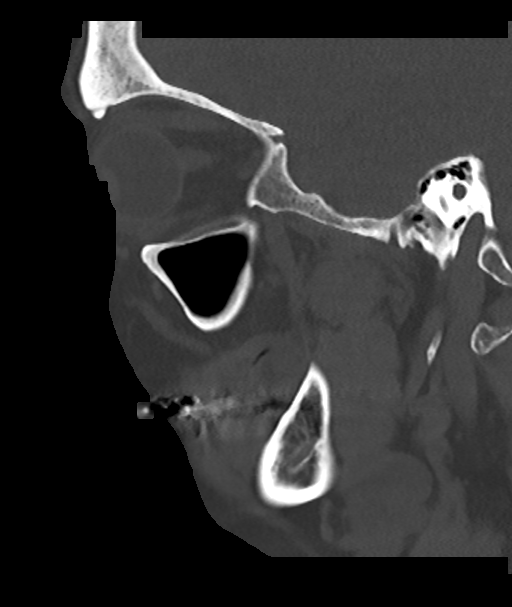

[15 of 47 positions shown; findings below may reference images not displayed]

FINDINGS: Osseous: No facial fracture identified.

Orbits: Unremarkable

Sinuses: Unremarkable

Soft tissues: Right frontal scalp hematoma partially included on
imaging on today's exam.

Limited intracranial: There is atherosclerotic calcification of the
cavernous carotid arteries bilaterally.
IMPRESSION: 1. Right frontal scalp hematoma is partially included on today's
exam. No underlying facial fracture or orbital abnormality.
2. There is atherosclerotic calcification of the cavernous carotid
arteries bilaterally.

## 2023-04-26 ENCOUNTER — Encounter: Payer: Self-pay | Admitting: Urology

## 2023-04-26 ENCOUNTER — Ambulatory Visit: Payer: Medicare HMO | Admitting: Urology

## 2023-04-26 VITALS — BP 128/90 | HR 74 | Ht 64.0 in | Wt 190.0 lb

## 2023-04-26 DIAGNOSIS — R339 Retention of urine, unspecified: Secondary | ICD-10-CM | POA: Diagnosis not present

## 2023-04-26 DIAGNOSIS — R399 Unspecified symptoms and signs involving the genitourinary system: Secondary | ICD-10-CM | POA: Diagnosis not present

## 2023-04-26 DIAGNOSIS — N8111 Cystocele, midline: Secondary | ICD-10-CM

## 2023-04-26 LAB — BLADDER SCAN AMB NON-IMAGING: Scan Result: 7

## 2023-04-26 LAB — URINALYSIS, COMPLETE
Bilirubin, UA: NEGATIVE
Glucose, UA: NEGATIVE
Ketones, UA: NEGATIVE
Leukocytes,UA: NEGATIVE
Nitrite, UA: NEGATIVE
Protein,UA: NEGATIVE
RBC, UA: NEGATIVE
Specific Gravity, UA: 1.015 (ref 1.005–1.030)
Urobilinogen, Ur: 0.2 mg/dL (ref 0.2–1.0)
pH, UA: 5.5 (ref 5.0–7.5)

## 2023-04-26 LAB — MICROSCOPIC EXAMINATION: RBC, Urine: NONE SEEN /hpf (ref 0–2)

## 2023-04-26 MED ORDER — TAMSULOSIN HCL 0.4 MG PO CAPS: 0.4000 mg | ORAL_CAPSULE | Freq: Every day | ORAL | 0 refills | Status: AC

## 2023-04-26 NOTE — Progress Notes (Signed)
Marcelle Overlie Plume,acting as a scribe for Riki Altes, MD.,have documented all relevant documentation on the behalf of Riki Altes, MD,as directed by  Riki Altes, MD while in the presence of Riki Altes, MD.  04/26/2023 10:15 AM   Roanna Raider Glendell Docker May 18, 1956 [id number]  Referring provider: Gracelyn Nurse, MD [address] [place],  [place] [address]  Chief Complaint  Patient presents with   Urinary Retention    HPI: Taylor Norris is a 67 y.o. female who is referred for urinary retention.  2 month history of voiding symptoms, including urinary hesitancy, weak urinary stream, and prolonged bladder emptying Mild frequency/urgency Denies urinary incontinence Denies vaginal bulge/ protrusion Denies recurrent UTI, gross hematuria No flank, abdominal, or pelvic pain No prior hysterectomy and has had 2 previous C-sections   PMH: Past Medical History:  Diagnosis Date   Anemia    Coronary artery disease    Depression    Diabetes mellitus without complication (HCC)    Hypercholesteremia    Hypertension    Myocardial infarction (HCC)    Vitamin D deficiency     Surgical History: Past Surgical History:  Procedure Laterality Date   BACK SURGERY     c3 disc repair   c section x2     COLONOSCOPY WITH PROPOFOL N/A 02/07/2016   Procedure: COLONOSCOPY WITH PROPOFOL;  Surgeon: Wallace Cullens, MD;  Location: [facility] ENDOSCOPY;  Service: Gastroenterology;  Laterality: N/A;   CORONARY ANGIOPLASTY WITH STENT PLACEMENT  2013    Home Medications:  Allergies as of 04/26/2023       Reactions   Byetta 10 Mcg Pen [exenatide]    Doxycycline    Hydrocodone    Lisinopril    Losartan    Metformin And Related    Oxycodone    Penicillins    Sulfa Antibiotics         Medication List        Accurate as of April 26, 2023 10:15 AM. If you have any questions, ask your nurse or doctor.          STOP taking these medications    Cholecalciferol 50 MCG  (2000 UT) Caps   Empagliflozin-metFORMIN HCl 12.01-999 MG Tabs   estradiol 0.1 MG/GM vaginal cream Commonly known as: ESTRACE   metFORMIN 500 MG 24 hr tablet Commonly known as: GLUCOPHAGE-XR   traMADol 50 MG tablet Commonly known as: Ultram       TAKE these medications    aspirin 81 MG tablet Take 81 mg by mouth daily.   clopidogrel 75 MG tablet Commonly known as: PLAVIX Take 75 mg by mouth daily.   cyclobenzaprine 5 MG tablet Commonly known as: FLEXERIL Take 5 mg by mouth 3 (three) times daily as needed for muscle spasms.   DULoxetine 60 MG capsule Commonly known as: CYMBALTA Take 60 mg by mouth daily.   gabapentin 300 MG capsule Commonly known as: NEURONTIN Take 300 mg by mouth 3 (three) times daily.   insulin NPH Human 100 UNIT/ML injection Commonly known as: NOVOLIN N Inject into the skin.   metoprolol succinate 25 MG 24 hr tablet Commonly known as: TOPROL-XL Take 25 mg by mouth daily.   tamsulosin 0.4 MG Caps capsule Commonly known as: FLOMAX Take 1 capsule (0.4 mg total) by mouth daily.        Allergies:  Allergies  Allergen Reactions   Byetta 10 Mcg Pen [Exenatide]    Doxycycline    Hydrocodone  Lisinopril    Losartan    Metformin And Related    Oxycodone    Penicillins    Sulfa Antibiotics     Social History:  reports that she has quit smoking. She does not have any smokeless tobacco history on file. She reports that she does not drink alcohol and does not use drugs.   Physical Exam: BP (!) 128/90   Pulse 74   Ht 5\' 4"  (1.626 m)   Wt 190 lb (86.2 kg)   BMI 32.61 kg/m   Constitutional:  Alert and oriented, No acute distress. HEENT: [place] AT Respiratory: Normal respiratory effort, no increased work of breathing. GI: Abdomen is soft, nontender, nondistended, no abdominal masses GU: Urethra normal appearance. No mucosal lesions, grade 2 cystocele Psychiatric: Normal mood and affect.  Laboratory  Data:  Urinalysis: Dipstick/microscopy negative   Assessment & Plan:    1. Lower urinary tract symptoms Primarily obstructive voiding symptoms PVR 7 mL She has a grade 2 cystocele, questionably related to her voiding symptoms Trial tamsulosin 0.4 mg daily to assess for any improvement GYN referral placed to obtain their opinion regarding cystocele. She has previously been followed by Randa Evens at [facility], however, would be a re-established patient  I have reviewed the above documentation for accuracy and completeness, and I agree with the above.   Riki Altes, MD  [facility] [address] [place], [place] [address] [phone]

## 2023-05-25 ENCOUNTER — Encounter: Payer: Self-pay | Admitting: Urology

## 2023-05-28 NOTE — Telephone Encounter (Signed)
Noted thank you

## 2023-06-12 ENCOUNTER — Encounter: Payer: Self-pay | Admitting: Urology

## 2023-07-27 ENCOUNTER — Other Ambulatory Visit: Payer: Self-pay | Admitting: Internal Medicine

## 2023-07-27 DIAGNOSIS — Z1231 Encounter for screening mammogram for malignant neoplasm of breast: Secondary | ICD-10-CM

## 2023-08-10 ENCOUNTER — Ambulatory Visit
Admission: RE | Admit: 2023-08-10 | Discharge: 2023-08-10 | Disposition: A | Discharge status: Home / Self Care | Payer: Medicare HMO | Source: Ambulatory Visit | Attending: Internal Medicine | Admitting: Internal Medicine

## 2023-08-10 DIAGNOSIS — Z1231 Encounter for screening mammogram for malignant neoplasm of breast: Secondary | ICD-10-CM | POA: Insufficient documentation

## 2024-05-27 ENCOUNTER — Other Ambulatory Visit: Payer: Self-pay | Admitting: Student

## 2024-05-27 ENCOUNTER — Other Ambulatory Visit: Payer: Self-pay | Admitting: Internal Medicine

## 2024-05-27 ENCOUNTER — Ambulatory Visit
Admission: RE | Admit: 2024-05-27 | Discharge: 2024-05-27 | Disposition: A | Discharge status: Home / Self Care | Source: Ambulatory Visit | Attending: Internal Medicine | Admitting: Internal Medicine

## 2024-05-27 DIAGNOSIS — M79662 Pain in left lower leg: Secondary | ICD-10-CM | POA: Diagnosis present
